# Patient Record
Sex: Male | Born: 1970 | Race: White | Hispanic: No | Marital: Married | Smoking: Former smoker
Health system: Southern US, Community
[De-identification: ages and names within clinical notes are randomized; demographics above are authoritative.]

## PROBLEM LIST (undated history)

## (undated) DIAGNOSIS — F1010 Alcohol abuse, uncomplicated: Secondary | ICD-10-CM

## (undated) DIAGNOSIS — J45909 Unspecified asthma, uncomplicated: Secondary | ICD-10-CM

## (undated) DIAGNOSIS — R600 Localized edema: Secondary | ICD-10-CM

## (undated) DIAGNOSIS — F1990 Other psychoactive substance use, unspecified, uncomplicated: Secondary | ICD-10-CM

## (undated) DIAGNOSIS — F988 Other specified behavioral and emotional disorders with onset usually occurring in childhood and adolescence: Secondary | ICD-10-CM

## (undated) DIAGNOSIS — F419 Anxiety disorder, unspecified: Secondary | ICD-10-CM

## (undated) DIAGNOSIS — R0602 Shortness of breath: Secondary | ICD-10-CM

## (undated) DIAGNOSIS — K219 Gastro-esophageal reflux disease without esophagitis: Secondary | ICD-10-CM

## (undated) DIAGNOSIS — F32A Depression, unspecified: Secondary | ICD-10-CM

## (undated) DIAGNOSIS — T798XXA Other early complications of trauma, initial encounter: Secondary | ICD-10-CM

## (undated) DIAGNOSIS — K760 Fatty (change of) liver, not elsewhere classified: Secondary | ICD-10-CM

## (undated) DIAGNOSIS — C801 Malignant (primary) neoplasm, unspecified: Secondary | ICD-10-CM

## (undated) DIAGNOSIS — M549 Dorsalgia, unspecified: Secondary | ICD-10-CM

## (undated) DIAGNOSIS — J439 Emphysema, unspecified: Secondary | ICD-10-CM

## (undated) DIAGNOSIS — M2550 Pain in unspecified joint: Secondary | ICD-10-CM

## (undated) HISTORY — DX: Pain in unspecified joint: M25.50

## (undated) HISTORY — DX: Localized edema: R60.0

## (undated) HISTORY — DX: Alcohol abuse, uncomplicated: F10.10

## (undated) HISTORY — DX: Emphysema, unspecified: J43.9

## (undated) HISTORY — DX: Anxiety disorder, unspecified: F41.9

## (undated) HISTORY — DX: Other psychoactive substance use, unspecified, uncomplicated: F19.90

## (undated) HISTORY — DX: Other specified behavioral and emotional disorders with onset usually occurring in childhood and adolescence: F98.8

## (undated) HISTORY — DX: Other early complications of trauma, initial encounter: T79.8XXA

## (undated) HISTORY — DX: Shortness of breath: R06.02

## (undated) HISTORY — PX: KNEE ARTHROSCOPY W/ ACL RECONSTRUCTION: SHX1858

## (undated) HISTORY — DX: Fatty (change of) liver, not elsewhere classified: K76.0

## (undated) HISTORY — DX: Gastro-esophageal reflux disease without esophagitis: K21.9

## (undated) HISTORY — DX: Malignant (primary) neoplasm, unspecified: C80.1

## (undated) HISTORY — DX: Depression, unspecified: F32.A

## (undated) HISTORY — DX: Dorsalgia, unspecified: M54.9

---

## 2005-09-11 ENCOUNTER — Emergency Department: Payer: Self-pay | Admitting: Emergency Medicine

## 2009-07-16 ENCOUNTER — Encounter: Payer: Self-pay | Admitting: Family Medicine

## 2009-08-08 ENCOUNTER — Encounter: Payer: Self-pay | Admitting: Family Medicine

## 2009-09-08 ENCOUNTER — Encounter: Payer: Self-pay | Admitting: Family Medicine

## 2009-10-08 ENCOUNTER — Encounter: Payer: Self-pay | Admitting: Family Medicine

## 2010-08-23 ENCOUNTER — Ambulatory Visit: Payer: Self-pay | Admitting: Neurosurgery

## 2011-08-12 ENCOUNTER — Ambulatory Visit: Payer: Self-pay | Admitting: Sports Medicine

## 2011-08-25 ENCOUNTER — Ambulatory Visit: Payer: Self-pay | Admitting: General Practice

## 2014-08-02 NOTE — Op Note (Signed)
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   POSTOPERATIVE DIAGNOSES:  1. 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  2. Medial meniscus tear.   PROCEDURES PERFORMED: Left knee arthroscopy, removal of osteochondral loose bodies, and partial medial meniscectomy.   SURGEON: Laurice Record. Hooten, MD  ANESTHESIA: General.   ESTIMATED BLOOD LOSS: Minimal.   TOURNIQUET TIME: Not used.   DRAINS: None.   INDICATIONS FOR SURGERY: The patient is a 44 year old male who has been seen for some left knee pain and palpable masses along the medial aspect of the knee. MRI demonstrated findings consistent with osteochondral loose bodies. After discussion of the risks and benefits of surgical intervention, the patient expressed his understanding of the risks and benefits and agreed with plans for surgical intervention. Of note, the patient does have a previous history of anterior cruciate ligament reconstruction.   PROCEDURE IN DETAIL: The patient was brought into the operating room and, after adequate general anesthesia was achieved, a tourniquet was placed on the patient's left thigh and the leg was placed in a leg holder. All bony prominences were well padded. The patient's left knee and leg were cleaned and prepped with alcohol and DuraPrep and draped in the usual sterile fashion. The anticipated portal sites were injected with 0.25% Marcaine with epinephrine. An anterolateral portal was created and cannula was inserted. The scope was inserted and the knee was distended with fluid using the DePuy Mitek pump. The scope was advanced down the medial gutter. There were three osteochondral loose bodies with some synovial attachment along the medial gutter. These corresponded with the palpable masses. The scope was continued into the medial compartment. Under visualization with  the scope, an anteromedial portal was created and hook probe was inserted. Inspection of the medial compartment showed the articular surface to be in reasonably good condition. There was a small tear of the posterior horn of the medial meniscus which was debrided using meniscal punch and the 50 degree ArthroCare wand. The remaining rim of meniscus was visualized and probed and felt to be stable. The scope was then repositioned so as to visualize the osteochondral loose bodies. A pituitary rongeur was then used to remove the three osteochondral loose bodies. The site was then further debrided using a 4.5 mm shaver and hemostasis achieved using the ArthroCare wand. The scope was then advanced into the intercondylar region. The anterior cruciate ligament graft was visualized and probed and felt to be stable. Graft was functioning well with Lachman's test performed under visualization with the scope. The scope was removed from the anterolateral portal and reinserted via the anteromedial portal so as to better visualize the lateral compartment. The articular surface was in reasonably good condition. The lateral meniscus was visualized and probed and felt to stable. Finally, the scope was advanced so as to visualize the patellofemoral articulation. Good patellar tracking was noted. The articular surface was in good condition.   The knee was irrigated with copious amounts of fluid and then suctioned dry. The anterolateral portal was reapproximated using 3-0 nylon. A combination of 0.25% Marcaine with epinephrine and 4 mg of morphine was injected via the scope. The scope was removed and the anteromedial portal was reapproximated using 3-0 nylon. Sterile dressing was applied followed by application of an ice wrap.   The patient tolerated the procedure well. He was transported to the recovery room  in stable condition.  ____________________________ Laurice Record. Holley Bouche., MD jph:slb D: 08/26/2011 10:25:51  ET T: 08/26/2011 11:58:32 ET JOB#: [id number]  cc: Jeneen Rinks P. Holley Bouche., MD, <Dictator> Laurice Record Holley Bouche MD ELECTRONICALLY SIGNED 08/26/2011 21:33

## 2020-07-12 ENCOUNTER — Telehealth: Payer: Self-pay | Admitting: *Deleted

## 2020-07-12 NOTE — Telephone Encounter (Signed)
Attempted to contact to schedule lung screening scan. However there is no valid phone number in patient's chart.   dhs

## 2021-02-20 ENCOUNTER — Other Ambulatory Visit: Payer: Self-pay

## 2021-02-20 ENCOUNTER — Emergency Department: Payer: BC Managed Care – PPO

## 2021-02-20 ENCOUNTER — Emergency Department
Admission: EM | Admit: 2021-02-20 | Discharge: 2021-02-20 | Disposition: A | Discharge status: Home / Self Care | Payer: BC Managed Care – PPO | Attending: Emergency Medicine | Admitting: Emergency Medicine

## 2021-02-20 DIAGNOSIS — R0789 Other chest pain: Secondary | ICD-10-CM | POA: Diagnosis not present

## 2021-02-20 DIAGNOSIS — R059 Cough, unspecified: Secondary | ICD-10-CM | POA: Insufficient documentation

## 2021-02-20 DIAGNOSIS — J45909 Unspecified asthma, uncomplicated: Secondary | ICD-10-CM | POA: Insufficient documentation

## 2021-02-20 DIAGNOSIS — R0602 Shortness of breath: Secondary | ICD-10-CM | POA: Insufficient documentation

## 2021-02-20 DIAGNOSIS — J029 Acute pharyngitis, unspecified: Secondary | ICD-10-CM | POA: Diagnosis not present

## 2021-02-20 DIAGNOSIS — Z20822 Contact with and (suspected) exposure to covid-19: Secondary | ICD-10-CM | POA: Insufficient documentation

## 2021-02-20 HISTORY — DX: Unspecified asthma, uncomplicated: J45.909

## 2021-02-20 LAB — BASIC METABOLIC PANEL
Anion gap: 9 (ref 5–15)
BUN: 13 mg/dL (ref 6–20)
CO2: 22 mmol/L (ref 22–32)
Calcium: 9 mg/dL (ref 8.9–10.3)
Chloride: 105 mmol/L (ref 98–111)
Creatinine, Ser: 1.26 mg/dL — ABNORMAL HIGH (ref 0.61–1.24)
GFR, Estimated: >60 mL/min (ref >60)
Glucose, Bld: 90 mg/dL (ref 70–99)
Potassium: 3.9 mmol/L (ref 3.5–5.1)
Sodium: 136 mmol/L (ref 135–145)

## 2021-02-20 LAB — CBC
HCT: 46.1 % (ref 39.0–52.0)
Hemoglobin: 15.7 g/dL (ref 13.0–17.0)
MCH: 30.7 pg (ref 26.0–34.0)
MCHC: 34.1 g/dL (ref 30.0–36.0)
MCV: 90 fL (ref 80.0–100.0)
Platelets: 206 10*3/uL (ref 150–400)
RBC: 5.12 MIL/uL (ref 4.22–5.81)
RDW: 11.6 % (ref 11.5–15.5)
WBC: 6.1 10*3/uL (ref 4.0–10.5)
nRBC: 0 % (ref 0.0–0.2)

## 2021-02-20 LAB — RESP PANEL BY RT-PCR (FLU A&B, COVID) ARPGX2
Influenza A by PCR: NEGATIVE
Influenza B by PCR: NEGATIVE
SARS Coronavirus 2 by RT PCR: NEGATIVE

## 2021-02-20 LAB — TROPONIN I (HIGH SENSITIVITY): Troponin I (High Sensitivity): 3 ng/L (ref <18)

## 2021-02-20 NOTE — ED Provider Notes (Signed)
[facility] Emergency Department Provider Note    ____________________________________________   Event Date/Time   First MD Initiated Contact with Patient 02/20/21 1455     (approximate)  I have reviewed the triage vital signs and the nursing notes.   HISTORY  Chief Complaint Shortness of Breath and Chest Pain   HPI [name] [name] is a 50 y.o. male, history of asthma, presents to the emergency department for evaluation of chest pain and shortness of breath x3 days.  Patient states that he initially experienced sore throat, sinus congestion, and chest discomfort approximately 1 month prior.  After a few weeks his symptoms resolved.  However patient states that the past 3 to 4 days, he has been experiencing a recurrence of the same symptoms, plus notable wheezing when walking for prolonged periods.  He he endorses chest tightness and soreness following coughing episodes.  Denies fevers/chills, back pain, abdominal pain, numbness or tingliness in his extremities, urinary symptoms, or nausea/vomiting/diarrhea.  History limited by: None  Past Medical History:  Diagnosis Date   Asthma     There are no problems to display for this patient.    Prior to Admission medications   Not on File    Allergies Patient has no known allergies.  No family history on file.  Social History    Review of Systems  Constitutional: Negative for fever/chills, weight loss, or fatigue.  Eyes: Negative for visual changes or discharge.  ENT: Positive for sinus congestion. negative for hearing changes, or sore throat.  Respiratory: Positive for chest discomfort and shortness of breath while walking.  Gastrointestinal: Negative for abdominal pain, nausea/vomiting, or diarrhea.  Genitourinary: Negative for dysuria or hematuria.  Musculoskeletal: Negative for back pain or joint pain.  Skin: Negative for rashes or lesions.  Neurological: Negative for headache, syncope,  dizziness, tremors, or numbness/tingling.   10-point ROS otherwise negative. ____________________________________________   PHYSICAL EXAM:  VITAL SIGNS: ED Triage Vitals  Enc Vitals Group     BP 02/20/21 1331 122/80     Pulse Rate 02/20/21 1331 89     Resp 02/20/21 1331 20     Temp 02/20/21 1331 98.5 F (36.9 C)     Temp Source 02/20/21 1331 Oral     SpO2 02/20/21 1331 95 %     Weight 02/20/21 1331 275 lb (124.7 kg)     Height 02/20/21 1331 5\' 11"  (1.803 m)     Head Circumference --      Peak Flow --      Pain Score 02/20/21 1334 0     Pain Loc --      Pain Edu? --      Excl. in [place]? --     Physical Exam Constitutional:      Appearance: Normal appearance.  HENT:     Head: Normocephalic.     Nose: Nose normal.     Mouth/Throat:     Mouth: Mucous membranes are dry.     Pharynx: No oropharyngeal exudate or posterior oropharyngeal erythema.  Eyes:     Extraocular Movements: Extraocular movements intact.     Conjunctiva/sclera: Conjunctivae normal.     Pupils: Pupils are equal, round, and reactive to light.  Cardiovascular:     Rate and Rhythm: Normal rate.     Heart sounds: Normal heart sounds.  Pulmonary:     Effort: Pulmonary effort is normal.     Breath sounds: Normal breath sounds.     Comments: Mild rhonchi noted in the  left basilar region. Abdominal:     General: Abdomen is flat.     Palpations: Abdomen is soft.  Musculoskeletal:        General: Normal range of motion.     Cervical back: Normal range of motion and neck supple. No rigidity.     Right lower leg: No edema.     Left lower leg: No edema.  Skin:    General: Skin is warm and dry.  Neurological:     Mental Status: He is alert and oriented to person, place, and time.     Cranial Nerves: No cranial nerve deficit.     Sensory: No sensory deficit.     Motor: No weakness.     Coordination: Coordination normal.  Psychiatric:        Mood and Affect: Mood normal.        Behavior: Behavior normal.         Thought Content: Thought content normal.        Judgment: Judgment normal.     ____________________________________________    LABS  (all labs ordered are listed, but only abnormal results are displayed)  Labs Reviewed  BASIC METABOLIC PANEL - Abnormal; Notable for the following components:      Result Value   Creatinine, Ser 1.26 (*)    All other components within normal limits  RESP PANEL BY RT-PCR (FLU A&B, COVID) [id number]  CBC  TROPONIN I (HIGH SENSITIVITY)     ____________________________________________   EKG ED ECG REPORT I personally viewed and interpreted this ECG.   Date: 02/20/2021  EKG Time: 1326  Rate: 86  Rhythm: normal EKG, normal sinus rhythm, unchanged from previous tracings  Axis: No deviation  Intervals:none  ST&T Change: No changes    ____________________________________________    RADIOLOGY I personally viewed and evaluated these images as part of my medical decision making, as well as reviewing the written report by the radiologist.  ED Provider Interpretation: I agree with the radiologist interpretation.  Left basilar opacity noted.   DG Chest 2 View  Result Date: 02/20/2021 CLINICAL DATA:  Chest pain EXAM: CHEST - 2 VIEW COMPARISON:  None. FINDINGS: The heart size and mediastinal contours are within normal limits. Mild left basilar opacity, likely due to atelectasis. Lungs otherwise clear. The visualized skeletal structures are unremarkable. IMPRESSION: Mild left basilar opacity, likely due to atelectasis. Lungs otherwise clear. Electronically Signed   By: Yetta Glassman M.D.   On: 02/20/2021 14:09    ____________________________________________   PROCEDURES  Procedures   Medications - No data to display  Critical Care performed: No  ____________________________________________   INITIAL IMPRESSION / ASSESSMENT AND PLAN / ED COURSE  Pertinent labs & imaging results that were available during my care of the patient were  reviewed by me and considered in my medical decision making (see chart for details).     [name] [name] is a 50 y.o. male, history of asthma, presents to the emergency department for evaluation of chest discomfort and shortness of breath.  Patient states that symptoms initially started a month prior, resolved after a few weeks, and have now recently recurred over the past 3 to 4 days with subjective wheezing while walking.  Differentials include, but not limited to: Serious: pulmonary embolism, pneumothorax, ACS, CHF exacerbation, thoracic trauma, anaphylaxis Common:viral syndrome, bronchitis, COPD exacerbation, pneumonia, asthma  On exam, patient appears well.  Vital signs are within normal limits.  Mild rhonchi noted in the left basilar lung field, but otherwise unimpressive.  No concerning murmurs noted.  Initial EKG shows sinus rhythm with no ST segment changes.  Initial troponin unremarkable.  Patient's history is not concerning for cardiac pathology. The second troponin was discontinued.   Chest x-ray shows opacity in the left basilar region, atelectasis.  I suspect that this is likely due to a viral syndrome, exacerbated by history of asthma.  History and physical exam not concerning for pneumonia.   Negative for influenza or COVID.  I suspect that the patient is experiencing residual symptoms from prior respiratory viral versus progression of chronic asthma.  We will plan to discharge patient with primary care follow-up.  He is currently scheduled for a physical this Friday.        Clinical Course as of 02/20/21 1706  Sun Feb 20, 2021  1701 Resp Panel by RT-PCR (Flu A&B, Covid) Nasopharyngeal Swab Respiratory panel negative for influenza or COVID.  [BS]    Clinical Course User Index [BS] Teodoro Spray, PA    ____________________________________________   FINAL CLINICAL IMPRESSION(S) / ED DIAGNOSES  Final diagnoses:  Shortness of breath     NEW  MEDICATIONS STARTED DURING THIS VISIT:  ED Discharge Orders     None        Note:  This document was prepared using Dragon voice recognition software and may include unintentional dictation errors.    Teodoro Spray, [place] 02/20/21 1706    Vladimir Crofts, MD 02/20/21 1728

## 2021-02-20 NOTE — ED Triage Notes (Signed)
Pt states he has had chest tightness and SHOB x3 days- pt states he feels like he has got a cold with congestion and cough- pt states he thought he was going to be okay until he tried to go to work this AM and by the time he walked across the parking lot he was winded

## 2021-02-20 NOTE — Discharge Instructions (Addendum)
Follow up with your PCP at your next physical this Friday.

## 2021-02-20 NOTE — ED Notes (Signed)
Dc ppw provided. Followup care reviewed. Pt denies questions at this time. Pt verbal consent for dc given. Pt assisted off unit on foot. Vs refused at dc

## 2021-03-02 ENCOUNTER — Other Ambulatory Visit: Payer: Self-pay | Admitting: Family Medicine

## 2021-03-02 DIAGNOSIS — R9389 Abnormal findings on diagnostic imaging of other specified body structures: Secondary | ICD-10-CM

## 2021-03-21 ENCOUNTER — Ambulatory Visit
Admission: RE | Admit: 2021-03-21 | Discharge: 2021-03-21 | Disposition: A | Discharge status: Home / Self Care | Payer: BC Managed Care – PPO | Attending: Family Medicine | Admitting: Family Medicine

## 2021-03-21 ENCOUNTER — Ambulatory Visit
Admission: RE | Admit: 2021-03-21 | Discharge: 2021-03-21 | Disposition: A | Discharge status: Home / Self Care | Payer: BC Managed Care – PPO | Source: Ambulatory Visit | Attending: Family Medicine | Admitting: Family Medicine

## 2021-03-21 DIAGNOSIS — R9389 Abnormal findings on diagnostic imaging of other specified body structures: Secondary | ICD-10-CM | POA: Diagnosis not present

## 2021-05-02 ENCOUNTER — Other Ambulatory Visit: Payer: Self-pay | Admitting: *Deleted

## 2021-05-02 DIAGNOSIS — Z87891 Personal history of nicotine dependence: Secondary | ICD-10-CM

## 2021-05-18 ENCOUNTER — Telehealth (INDEPENDENT_AMBULATORY_CARE_PROVIDER_SITE_OTHER): Payer: BC Managed Care – PPO | Admitting: Acute Care

## 2021-05-18 ENCOUNTER — Encounter: Payer: Self-pay | Admitting: Acute Care

## 2021-05-18 ENCOUNTER — Other Ambulatory Visit: Payer: Self-pay

## 2021-05-18 ENCOUNTER — Ambulatory Visit
Admission: RE | Admit: 2021-05-18 | Discharge: 2021-05-18 | Disposition: A | Discharge status: Home / Self Care | Payer: BC Managed Care – PPO | Source: Ambulatory Visit | Attending: Acute Care | Admitting: Acute Care

## 2021-05-18 DIAGNOSIS — Z87891 Personal history of nicotine dependence: Secondary | ICD-10-CM

## 2021-05-18 NOTE — Patient Instructions (Signed)
Thank you for participating in the [place] Lung Cancer Screening Program. °It was our pleasure to meet you today. °We will call you with the results of your scan within the next few days. °Your scan will be assigned a Lung RADS category score by the physicians reading the scans.  °This Lung RADS score determines follow up scanning.  °See below for description of categories, and follow up screening recommendations. °We will be in touch to schedule your follow up screening annually or based on recommendations of our providers. °We will fax a copy of your scan results to your Primary Care Physician, or the physician who referred you to the program, to ensure they have the results. °Please call the office if you have any questions or concerns regarding your scanning experience or results.  °Our office number is [phone]. °Please speak with Denise Phelps, RN. She is our Lung Cancer Screening RN. °If she is unavailable when you call, please have the office staff send her a message. She will return your call at her earliest convenience. °Remember, if your scan is normal, we will scan you annually as long as you continue to meet the criteria for the program. (Age 55-77, Current smoker or smoker who has quit within the last 15 years). °If you are a smoker, remember, quitting is the single most powerful action that you can take to decrease your risk of lung cancer and other pulmonary, breathing related problems. °We know quitting is hard, and we are here to help.  °Please let us know if there is anything we can do to help you meet your goal of quitting. °If you are a former smoker, congratulations. We are proud of you! Remain smoke free! °Remember you can refer friends or family members through the number above.  °We will screen them to make sure they meet criteria for the program. °Thank you for helping us take better care of you by participating in Lung Screening. ° °You can receive free nicotine replacement therapy  ( patches, gum or mints) by calling 1-800-QUIT NOW. Please call so we can get you on the path to becoming  a non-smoker. I know it is hard, but you can do this! ° °Lung RADS Categories: ° °Lung RADS 1: no nodules or definitely non-concerning nodules.  °Recommendation is for a repeat annual scan in 12 months. ° °Lung RADS 2:  nodules that are non-concerning in appearance and behavior with a very low likelihood of becoming an active cancer. °Recommendation is for a repeat annual scan in 12 months. ° °Lung RADS 3: nodules that are probably non-concerning , includes nodules with a low likelihood of becoming an active cancer.  Recommendation is for a 6-month repeat screening scan. Often noted after an upper respiratory illness. We will be in touch to make sure you have no questions, and to schedule your 6-month scan. ° °Lung RADS 4 A: nodules with concerning findings, recommendation is most often for a follow up scan in 3 months or additional testing based on our provider's assessment of the scan. We will be in touch to make sure you have no questions and to schedule the recommended 3 month follow up scan. ° °Lung RADS 4 B:  indicates findings that are concerning. We will be in touch with you to schedule additional diagnostic testing based on our provider's  assessment of the scan. ° °Hypnosis for smoking cessation  °[employer] °[phone] ° °Acupuncture for smoking cessation  °[facility] °[phone]  °

## 2021-05-18 NOTE — Progress Notes (Signed)
Virtual Visit via Video Note  I connected with [name] [name] on 02/22/21 at  2:00 PM EST by telephone and verified that I am speaking with the correct person using two identifiers.  Location: Patient: Home Provider: Working from home   I discussed the limitations, risks, security and privacy concerns of performing an evaluation and management service by telephone and the availability of in person appointments. I also discussed with the patient that there may be a patient responsible charge related to this service. The patient expressed understanding and agreed to proceed.  Shared Decision Making Visit Lung Cancer Screening Program [phone])   Eligibility: Age 51 y.o. Pack Years Smoking History Calculation 29 (# packs/per year x # years smoked) Recent History of coughing up blood  no Unexplained weight loss? no ( >Than 15 pounds within the last 6 months ) Prior History Lung / other cancer no (Diagnosis within the last 5 years already requiring surveillance chest CT Scans). Smoking Status Former Smoker Former Smokers: Years since quit: 8 years  Quit Date: 2015  Visit Components: Discussion included one or more decision making aids. yes Discussion included risk/benefits of screening. yes Discussion included potential follow up diagnostic testing for abnormal scans. yes Discussion included meaning and risk of over diagnosis. yes Discussion included meaning and risk of False Positives. yes Discussion included meaning of total radiation exposure. yes  Counseling Included: Importance of adherence to annual lung cancer LDCT screening. yes Impact of comorbidities on ability to participate in the program. yes Ability and willingness to under diagnostic treatment. yes  Smoking Cessation Counseling: Current Smokers:  Discussed importance of smoking cessation. yes Information about tobacco cessation classes and interventions provided to patient. yes Patient provided with  "ticket" for LDCT Scan. yes Symptomatic Patient. no  Counseling NA Diagnosis Code: Tobacco Use Z72.0 Asymptomatic Patient yes  Counseling NA Former Smokers:  Discussed the importance of maintaining cigarette abstinence. yes Diagnosis Code: Personal History of Nicotine Dependence. J24.268 Information about tobacco cessation classes and interventions provided to patient. Yes Patient provided with "ticket" for LDCT Scan. yes Written Order for Lung Cancer Screening with LDCT placed in Epic. Yes (CT Chest Lung Cancer Screening Low Dose W/O CM) [id number] Z12.2-Screening of respiratory organs Z87.891-Personal history of nicotine dependence   I spent 25 minutes of face to face time with him discussing the risks and benefits of lung cancer screening. We viewed a power point together that explained in detail the above noted topics. We took the time to pause the power point at intervals to allow for questions to be asked and answered to ensure understanding. We discussed that he had taken the single most powerful action possible to decrease his risk of developing lung cancer when he quit smoking. I counseled him to remain smoke free, and to contact me if he ever had the desire to smoke again so that I can provide resources and tools to help support the effort to remain smoke free. We discussed the time and location of the scan, and that either  [name] or I will call with the results within  24-48 hours of receiving them. He has my card and contact information in the event he needs to speak with me, in addition to a copy of the power point we reviewed as a resource. He verbalized understanding of all of the above and had no further questions upon leaving the office.     I explained to the patient that there has been a high incidence of  coronary artery disease noted on these exams. I explained that this is a non-gated exam therefore degree or severity cannot be determined. This patient is not on  statin therapy. I have asked the patient to follow-up with their PCP regarding any incidental finding of coronary artery disease and management with diet or medication as they feel is clinically indicated. The patient verbalized understanding of the above and had no further questions.   [name] [name] D. [name], NP-C [facility] Personal contact information can be found on Amion  05/18/2021, 8:13 AM

## 2021-05-19 ENCOUNTER — Telehealth: Payer: Self-pay | Admitting: Acute Care

## 2021-05-19 NOTE — Telephone Encounter (Signed)
Spoke with rad tech and was advised of call report for pts LDCT from 05/19/21. Impression is below. Will forward to Eric Form, NP, as Juluis Rainier as these results are called through the lung cancer screening program.    IMPRESSION: 1. Lung-RADS 3, probably benign findings. Short-term follow-up in 6 months is recommended with repeat low-dose chest CT without contrast (please use the following order, "CT CHEST LCS NODULE FOLLOW-UP W/O CM").   6.9 mm subpleural posteromedial right lower lobe nodule.   These results will be called to the ordering clinician or representative by the Radiologist Assistant, and communication documented in the PACS or [employer]. 2. Coronary artery calcification. 3.  Emphysema (ICD10-J43.9).     Electronically Signed   By: Lorin Picket M.D.   On: 05/19/2021 11:55

## 2021-05-26 ENCOUNTER — Telehealth: Payer: Self-pay | Admitting: Acute Care

## 2021-05-26 NOTE — Telephone Encounter (Signed)
I  have attempted to call the patient with the results of his low-dose CT.  There was no answer.  I have left a HIPAA compliant message on the patient's answering machine requesting that he call the office for the results of his scan.  I left the office contact information on the voicemail. Denise patient will need a 61-month follow-up low-dose CT, and once we have talked to him we will need to fax the results to his PCP. Thank so much

## 2021-05-27 ENCOUNTER — Telehealth: Payer: Self-pay | Admitting: Acute Care

## 2021-05-27 ENCOUNTER — Other Ambulatory Visit: Payer: Self-pay

## 2021-05-27 DIAGNOSIS — Z87891 Personal history of nicotine dependence: Secondary | ICD-10-CM

## 2021-05-27 DIAGNOSIS — R911 Solitary pulmonary nodule: Secondary | ICD-10-CM

## 2021-05-27 NOTE — Telephone Encounter (Signed)
I have attempted to call th patient with the results of his scan as he called and left a message after missing my call yesterday. There was no answer. We will call again this afternoon.

## 2021-05-27 NOTE — Telephone Encounter (Signed)
Contacted patient to review LDCT results. Lung nodule noted with area of mucoid impaction.  Patient states he has had increased chest congestion since November 2022 which has not improved.  He has been given Symbicort recently but states nothing has really helped the congestion.  Patient informed recommendation is to have follow up CT scan in 6 months to monitor the area of concern.  Patient agrees with plan.  Also informed of calcification in coronary arteries and emphysema.  Patient is not on a statin medication. Will route results and plan to PCP for review.  Pulmonary consult offered for patient's complaint of chest congestion and some recent fatigue.  Consult accepted and scheduled.  Patient acknowledged understanding and had no further questions.

## 2021-05-30 NOTE — Telephone Encounter (Signed)
See 05/27/21 phone note for called results

## 2021-06-08 ENCOUNTER — Other Ambulatory Visit: Payer: Self-pay

## 2021-06-08 ENCOUNTER — Encounter: Payer: BC Managed Care – PPO | Admitting: *Deleted

## 2021-06-08 ENCOUNTER — Encounter: Payer: Self-pay | Admitting: Pulmonary Disease

## 2021-06-08 ENCOUNTER — Ambulatory Visit: Payer: BC Managed Care – PPO | Admitting: Pulmonary Disease

## 2021-06-08 VITALS — BP 126/82 | HR 93 | Ht 71.0 in | Wt 272.0 lb

## 2021-06-08 DIAGNOSIS — R911 Solitary pulmonary nodule: Secondary | ICD-10-CM

## 2021-06-08 DIAGNOSIS — J4520 Mild intermittent asthma, uncomplicated: Secondary | ICD-10-CM

## 2021-06-08 DIAGNOSIS — J432 Centrilobular emphysema: Secondary | ICD-10-CM | POA: Diagnosis not present

## 2021-06-08 DIAGNOSIS — Z87891 Personal history of nicotine dependence: Secondary | ICD-10-CM | POA: Diagnosis not present

## 2021-06-08 DIAGNOSIS — Z006 Encounter for examination for normal comparison and control in clinical research program: Secondary | ICD-10-CM

## 2021-06-08 NOTE — Research (Signed)
Title: [place]: CliNIcal Utility of ManaGement of Patients witH CT and LDCT Identified Pulmonary Nodules UsinG the Percepta NasAL Swab ClassifiEr -- with Familiarization ?  ?Protocol #: [id number] Sponsor: [employer] ?  ?Protocol Revision 1 dated 01Sep2022 and confirmed current on today's visit, IRB approved Revision 1 on 29Dec2022. ?  ?Objectives:  ?Primary: To evaluate if use of the Percepta Nasal Swab test in the diagnostic work up ?of newly identified pulmonary nodules reduces the number of invasive procedures in the ?group classified as low-risk by the test and that are benign as compared to a control ?group managed without a Percepta Nasal Swab test result. ?                  A newly identified nodule is defined as any nodule first identified on imaging ?                  <90 days prior to nasal sample collection that hasn?t undergone a diagnostic ?                   procedure for the management of their index nodule prior to enrollment. ?                  CT imaging includes conventional CT, LDCT, HRCT ?                  Benign diagnosis is defined as a specific diagnosis of a benign condition, ?                   radiographic resolution or stability at ? 24 months, or no cytological, ?                   radiological, or pathological evidence of cancer. ?                  Procedures will be categorized as either invasive or non-invasive in the Data ?                   Management Plan (DMP). ?Secondary: To evaluate if use of the Percepta Nasal Swab test in the diagnostic work ?up of newly identified pulmonary nodules increases the proportion of subjects classified ?as high-risk by the test and have primary lung cancer that go directly to appropriate ?therapy as compared to a control group managed without a Percepta Nasal Swab test ?result. ?                   Proportion of subjects that go directly to appropriate therapy is defined as those ?                    subjects that undergo surgery, ablative  or other appropriate therapy as the next ?                    step after the Percepta Nasal Swab test result without intervening non-surgical ?                    procedures ?                            a. Non-surgical procedures include diagnostic PET, but not PET for ?                                  staging purposes. ?                            b. Appropriate therapies will be defined in the CRF. ?                   A newly identified nodule is defined as any nodule first identified on imaging ?                   <90 days prior to nasal sample collection. ?                   Lung cancer diagnosis is defined as established by cytology or pathology, or in ?                    circumstances where a presumptive diagnosis of cancer led to definitive ?                    ablative or other appropriate therapy without pathology. ?Key Inclusion Criteria: ? Inclusion Criteria: ? Able to tolerate nasal epithelial specimen collection ? Signed written Informed Consent obtained ? Subject clinical history available for review by sponsor and regulatory agencies ? New nodule first identified on imaging < 90 days prior to nasal sample collection ?(index nodule) ? CT report available for index nodule ? 19 - 1 years of age ? Current or former smoker (>100 cigarettes in a lifetime) ? Pulmonary nodule ?30 mm detected by CT ? ?Key Exclusion Criteria: ?Exclusion Criteria ? Subject has undergone a diagnostic procedure for the management of their ?index nodule after the index CT and prior to enrollment ? Active cancer (other than non-melanoma skin cancer) ? Prior primary lung cancer (prior non-lung cancer acceptable) ? Prior participation in this study (i.e., subjects may not be enrolled more than ?once) ? Current active treatment with an investigational device or drug (patients in trial ?follow up period are okay if intervention phase is complete) ? Patient enrolled or planned to be enrolled in another clinical trial that may ?influence  management of the patient?s nodule ? Concurrent or planned use of tools or tests for assigning lung nodule risk of ?malignancy (e.g., genomic or proteomic blood tests) other than clinically ?validated risk calculators ? ?Clinical Research Coordinator / Research RN note : This visit is for enrollment / baseline Subject [id number] with DOB: [id number] on 01Mar2023 for the above protocol is an Enrollment Visit and is for purpose of research.  ?  ?Subject expressed interest and consent in continuing as a study subject. Subject confirmed contact information (e.g. address, telephone, email). Subject thanked for participation in research and contribution to science.   ?  ?During this visit on 01MAR2023  , the subject reviewed and signed the consent form, provided demographics, and had a nasal swab collected per the above referenced protocol. Please refer to the subject's paper source binder for further details.  ? ?The PI and Sub-I met/discussed the subject prior to consenting patient.  The sub-Investigator  Eric Form, NP  was present for the consenting process.  ?     ?  ?Signed by ?Jaye Beagle RN, CRN II ? ?

## 2021-06-08 NOTE — Patient Instructions (Addendum)
Continue symbicort inhaler 2 puffs twice daily ?- rinse mouth out after each use ? ?Use flutter valve twice daily to help clear chest congestion ?- we will order you a device from a medical equipment company ? ?We can consider nebulizer treatments in the future or increasing your symbicort dosing. ? ?Follow up in 3 months with pulmonary function tests ? ?

## 2021-06-08 NOTE — Progress Notes (Signed)
? ?Synopsis: Referred in March 2023 for chest congestion ? ?Subjective:  ? ?PATIENT ID: [name] [name] GENDER: male DOB: 03-16-1971, MRN: [id number] ? ?HPI ? ?Chief Complaint  ?Patient presents with  ? Consult  ?  Self referral for chronic cough since Nov 2022, productive cough with white phlegm. Increased chest tightness, wheezing and SOB.   ? ?[name] is a 51 year old male, former smoker who is referred to pulmonary clinic for chest congestion. ? ?He has been seen by our lung cancer screening team previously and is being follow for pulmonary nodules. The CT Chest scan also showed emphysema and areas of mucoid impaction.  ? ?Patient reports ongoing chest congestion since last fall after experiencing upper respiratory viral infection.  He has intermittent shortness of breath, cough and wheezing.  When he is active he is able to have productive cough which helps him feel better after the mucus is cleared.  He tried Mucinex previously without much relief.  He reports sitting in a reclined position can make his symptoms worse.  He is sleeping well with no nighttime awakenings due to cough.  He started Symbicort 80-4.5 mcg 1 puff twice daily with some improvement in his symptoms. ? ?He is a former smoker and quit in 2015.  He has a 30-pack-year smoking history and is currently undergoing lung cancer screening.  He works as a Pharmacist, community at [employer].  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  He does not report any known asbestos exposure.  He also previously worked as a Audiological scientist. ? ?Past Medical History:  ?Diagnosis Date  ? Asthma   ?  ? ?No family history on file.  ? ?Social History  ? ?Socioeconomic History  ? Marital status: Married  ?  Spouse name: Not on file  ? Number of children: Not on file  ? Years of education: Not on file  ? Highest education level: Not on file  ?Occupational History  ? Not on file  ?Tobacco Use   ? Smoking status: Former  ?  Packs/day: 1.00  ?  Years: 29.00  ?  Pack years: 29.00  ?  Types: Cigarettes  ?  Quit date: 2015  ?  Years since quitting: 8.1  ? Smokeless tobacco: Not on file  ?Substance and Sexual Activity  ? Alcohol use: Not on file  ? Drug use: Not on file  ? Sexual activity: Not on file  ?Other Topics Concern  ? Not on file  ?Social History Narrative  ? Not on file  ? ?Social Determinants of Health  ? ?Financial Resource Strain: Not on file  ?Food Insecurity: Not on file  ?Transportation Needs: Not on file  ?Physical Activity: Not on file  ?Stress: Not on file  ?Social Connections: Not on file  ?Intimate Partner Violence: Not on file  ?  ? ?No Known Allergies  ? ?Outpatient Medications Prior to Visit  ?Medication Sig Dispense Refill  ? budesonide-formoterol (SYMBICORT) 80-4.5 MCG/ACT inhaler Inhale 1 puff into the lungs 2 (two) times daily.    ? clobetasol ointment (TEMOVATE) 0.05 % Apply topically 2 (two) times daily.    ? COMBIVENT RESPIMAT 20-100 MCG/ACT AERS respimat Inhale 1 puff into the lungs 4 (four) times daily.    ? LORazepam (ATIVAN) 0.5 MG tablet Take 0.5 mg by mouth daily as needed.    ? ?No facility-administered medications prior to visit.  ? ?Review of Systems  ?Constitutional:  Negative for  chills, fever, malaise/fatigue and weight loss.  ?HENT:  Negative for congestion, sinus pain and sore throat.   ?Eyes: Negative.   ?Respiratory:  Positive for cough, shortness of breath and wheezing. Negative for hemoptysis and sputum production.   ?Cardiovascular:  Negative for chest pain, palpitations, orthopnea, claudication and leg swelling.  ?Gastrointestinal:  Negative for abdominal pain, heartburn, nausea and vomiting.  ?Genitourinary: Negative.   ?Musculoskeletal:  Negative for joint pain and myalgias.  ?Skin:  Negative for rash.  ?Neurological:  Negative for weakness.  ?Endo/Heme/Allergies: Negative.   ?Psychiatric/Behavioral:  Positive for depression. The patient is nervous/anxious.    ? ?Objective:  ? ?Vitals:  ? 06/08/21 1343  ?BP: 126/82  ?Pulse: 93  ?SpO2: 98%  ?Weight: 272 lb (123.4 kg)  ?Height: 5\' 11"  (1.803 m)  ? ?Physical Exam ?Constitutional:   ?   General: He is not in acute distress. ?HENT:  ?   Head: Normocephalic and atraumatic.  ?Eyes:  ?   Extraocular Movements: Extraocular movements intact.  ?   Conjunctiva/sclera: Conjunctivae normal.  ?   Pupils: Pupils are equal, round, and reactive to light.  ?Cardiovascular:  ?   Rate and Rhythm: Normal rate and regular rhythm.  ?   Pulses: Normal pulses.  ?   Heart sounds: Normal heart sounds. No murmur heard. ?Pulmonary:  ?   Effort: Pulmonary effort is normal.  ?   Breath sounds: No wheezing, rhonchi or rales.  ?Abdominal:  ?   General: Bowel sounds are normal.  ?   Palpations: Abdomen is soft.  ?Musculoskeletal:  ?   Right lower leg: No edema.  ?   Left lower leg: No edema.  ?Lymphadenopathy:  ?   Cervical: No cervical adenopathy.  ?Skin: ?   General: Skin is warm and dry.  ?Neurological:  ?   General: No focal deficit present.  ?   Mental Status: He is alert.  ?Psychiatric:     ?   Mood and Affect: Mood normal.     ?   Behavior: Behavior normal.     ?   Thought Content: Thought content normal.     ?   Judgment: Judgment normal.  ? ?CBC ?   ?Component Value Date/Time  ? WBC 6.1 02/20/2021 1336  ? RBC 5.12 02/20/2021 1336  ? HGB 15.7 02/20/2021 1336  ? HCT 46.1 02/20/2021 1336  ? PLT 206 02/20/2021 1336  ? MCV 90.0 02/20/2021 1336  ? MCH 30.7 02/20/2021 1336  ? MCHC 34.1 02/20/2021 1336  ? RDW 11.6 02/20/2021 1336  ? ?BMP Latest Ref Rng & Units 02/20/2021  ?Glucose 70 - 99 mg/dL 90  ?BUN 6 - 20 mg/dL 13  ?Creatinine 0.61 - 1.24 mg/dL 1.26(H)  ?Sodium 135 - 145 mmol/L 136  ?Potassium 3.5 - 5.1 mmol/L 3.9  ?Chloride 98 - 111 mmol/L 105  ?CO2 22 - 32 mmol/L 22  ?Calcium 8.9 - 10.3 mg/dL 9.0  ? ?Chest imaging: ?LCS CT Chest 05/18/21 ?Mediastinum/Nodes: No pathologically enlarged mediastinal or ?axillary lymph nodes. Hilar regions are difficult  to definitively ?evaluate without IV contrast. Esophagus is grossly unremarkable. ?  ?Lungs/Pleura: Centrilobular and paraseptal emphysema. Mild smoking ?related respiratory bronchiolitis. Scattered mucoid impaction. ?Subpleural scarring in the lingula. Clustered peribronchovascular ?nodularity in the posteromedial right lower lobe (3/204-212) is ?likely postinfectious in etiology. 6.9 mm subpleural posteromedial ?right lower lobe nodule (3/240). No pleural fluid. Airway is ?unremarkable. ? ?PFT: ?No flowsheet data found. ? ?Labs: ? ?Path: ? ?Echo: ? ?Heart Catheterization: ? ?Assessment &  Plan:  ? ?Mild intermittent reactive airway disease without complication - Plan: Pulmonary Function Test, Ambulatory Referral for DME ? ?Centrilobular emphysema ([place]) ? ?Former cigarette smoker ? ?Discussion: ?Effie Janoski is a 51 year old male, former smoker who is referred to pulmonary clinic for chest congestion. ? ?Patient has known centrilobular emphysematous changes noted on his recent CT chest scan and has history concerning for possible reactive airways disease as well. ? ?He is to continue Symbicort 80-4.5 mcg 2 puffs twice daily instead of 1 puff twice daily.  We will order him a flutter valve he can use twice daily for airway clearance due to his chest congestion.  We can consider nebulizer treatments in the future if needed. ? ?Follow-up in 3 months with pulmonary function test. ? ?Freda Jackson, MD ?[facility] ?Office: [phone] ? ? ?Current Outpatient Medications:  ?  budesonide-formoterol (SYMBICORT) 80-4.5 MCG/ACT inhaler, Inhale 1 puff into the lungs 2 (two) times daily., Disp: , Rfl:  ?  clobetasol ointment (TEMOVATE) 0.05 %, Apply topically 2 (two) times daily., Disp: , Rfl:  ?  COMBIVENT RESPIMAT 20-100 MCG/ACT AERS respimat, Inhale 1 puff into the lungs 4 (four) times daily., Disp: , Rfl:  ?  LORazepam (ATIVAN) 0.5 MG tablet, Take 0.5 mg by mouth daily as needed., Disp: ,  Rfl:  ? ? ?

## 2021-08-26 DIAGNOSIS — J449 Chronic obstructive pulmonary disease, unspecified: Secondary | ICD-10-CM | POA: Insufficient documentation

## 2021-08-26 DIAGNOSIS — E669 Obesity, unspecified: Secondary | ICD-10-CM | POA: Insufficient documentation

## 2021-09-08 ENCOUNTER — Encounter: Payer: Self-pay | Admitting: Pulmonary Disease

## 2021-09-08 ENCOUNTER — Ambulatory Visit (INDEPENDENT_AMBULATORY_CARE_PROVIDER_SITE_OTHER): Payer: BC Managed Care – PPO | Admitting: Pulmonary Disease

## 2021-09-08 ENCOUNTER — Ambulatory Visit: Payer: BC Managed Care – PPO | Admitting: Pulmonary Disease

## 2021-09-08 VITALS — BP 118/84 | HR 92 | Ht 71.5 in | Wt 272.8 lb

## 2021-09-08 DIAGNOSIS — R911 Solitary pulmonary nodule: Secondary | ICD-10-CM | POA: Diagnosis not present

## 2021-09-08 DIAGNOSIS — J4520 Mild intermittent asthma, uncomplicated: Secondary | ICD-10-CM

## 2021-09-08 DIAGNOSIS — J432 Centrilobular emphysema: Secondary | ICD-10-CM

## 2021-09-08 LAB — PULMONARY FUNCTION TEST
DL/VA % pred: 118 %
DL/VA: 5.18 ml/min/mmHg/L
DLCO cor % pred: 123 %
DLCO cor: 37.4 ml/min/mmHg
DLCO unc % pred: 123 %
DLCO unc: 37.4 ml/min/mmHg
FEF 25-75 Post: 2.34 L/sec
FEF 25-75 Pre: 2.02 L/sec
FEF2575-%Change-Post: 15 %
FEF2575-%Pred-Post: 66 %
FEF2575-%Pred-Pre: 57 %
FEV1-%Change-Post: 5 %
FEV1-%Pred-Post: 80 %
FEV1-%Pred-Pre: 76 %
FEV1-Post: 3.27 L
FEV1-Pre: 3.1 L
FEV1FVC-%Change-Post: 1 %
FEV1FVC-%Pred-Pre: 87 %
FEV6-%Change-Post: 3 %
FEV6-%Pred-Post: 91 %
FEV6-%Pred-Pre: 88 %
FEV6-Post: 4.65 L
FEV6-Pre: 4.47 L
FEV6FVC-%Change-Post: 0 %
FEV6FVC-%Pred-Post: 102 %
FEV6FVC-%Pred-Pre: 101 %
FVC-%Change-Post: 3 %
FVC-%Pred-Post: 89 %
FVC-%Pred-Pre: 86 %
FVC-Post: 4.73 L
FVC-Pre: 4.57 L
Post FEV1/FVC ratio: 69 %
Post FEV6/FVC ratio: 98 %
Pre FEV1/FVC ratio: 68 %
Pre FEV6/FVC Ratio: 98 %
RV % pred: 167 %
RV: 3.61 L
TLC % pred: 113 %
TLC: 8.24 L

## 2021-09-08 MED ORDER — TRELEGY ELLIPTA 100-62.5-25 MCG/ACT IN AEPB: 1.0000 | INHALATION_SPRAY | Freq: Every day | RESPIRATORY_TRACT | 0 refills | Status: DC

## 2021-09-08 NOTE — Progress Notes (Signed)
Full PFT Performed Today  

## 2021-09-08 NOTE — Patient Instructions (Signed)
Full PFT Performed Today  

## 2021-09-08 NOTE — Patient Instructions (Addendum)
Try trelegy ellipta 1 puff daily  - rinse mouth out after each use  Stop symbicort inhaler while on trelegy  Continue to use albuterol inhaler as needed 1-2 puffs every 4-6 hours  Your breathing tests show mild obstructive defect consistent with COPD. We will continue to monitor you to determine if there is component of asthma.   Follow up in 6 months

## 2021-09-08 NOTE — Progress Notes (Signed)
Synopsis: Referred in March 2023 for chest congestion  Subjective:   PATIENT ID: [name] [name] GENDER: male DOB: November 27, 1970, MRN: [id number]  HPI  Chief Complaint  Patient presents with   Follow-up    3 mo f/u after PFT. States he has been having more chest tightness. Had a small flare up a few weeks ago.    [name] [name] is a 51 year old male, former smoker who returns to pulmonary clinic for chest congestion.  He was instructed to take symbicort 80-4.24mg 2 puffs twice daily and as needed albuterol. We provided him with a flutter valve for airway clearance.   PFTs today show mild obstructive defect with air trapping.  He was seen by his PCP 08/26/21 for dyspnea which was felt to be related to anxiety.   Initial OV 06/08/21 He has been seen by our lung cancer screening team previously and is being follow for pulmonary nodules. The CT Chest scan also showed emphysema and areas of mucoid impaction.   Patient reports ongoing chest congestion since last fall after experiencing upper respiratory viral infection.  He has intermittent shortness of breath, cough and wheezing.  When he is active he is able to have productive cough which helps him feel better after the mucus is cleared.  He tried Mucinex previously without much relief.  He reports sitting in a reclined position can make his symptoms worse.  He is sleeping well with no nighttime awakenings due to cough.  He started Symbicort 80-4.5 mcg 1 puff twice daily with some improvement in his symptoms.  He is a former smoker and quit in 2015.  He has a 30-pack-year smoking history and is currently undergoing lung cancer screening.  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  He does not report any known asbestos exposure.  He also previously worked as a pAudiological scientist  Past Medical History:  Diagnosis Date   Asthma       No family history on file.   Social History   Socioeconomic History   Marital status: Married    Spouse name: Not on file   Number of children: Not on file   Years of education: Not on file   Highest education level: Not on file  Occupational History   Not on file  Tobacco Use   Smoking status: Former    Packs/day: 1.00    Years: 29.00    Pack years: 29.00    Types: Cigarettes    Quit date: 2015    Years since quitting: 8.4   Smokeless tobacco: Not on file  Substance and Sexual Activity   Alcohol use: Not on file   Drug use: Not on file   Sexual activity: Not on file  Other Topics Concern   Not on file  Social History Narrative   Not on file   Social Determinants of Health   Financial Resource Strain: Not on file  Food Insecurity: Not on file  Transportation Needs: Not on file  Physical Activity: Not on file  Stress: Not on file  Social Connections: Not on file  Intimate Partner Violence: Not on file     No Known Allergies   Outpatient Medications Prior to Visit  Medication Sig Dispense Refill   clobetasol ointment (TEMOVATE) 0.05 % Apply topically 2 (two) times daily.     COMBIVENT RESPIMAT 20-100 MCG/ACT AERS respimat Inhale 1 puff into the  lungs 4 (four) times daily.     fluticasone (FLONASE) 50 MCG/ACT nasal spray Place into the nose.     LORazepam (ATIVAN) 0.5 MG tablet Take 0.5 mg by mouth daily as needed.     budesonide-formoterol (SYMBICORT) 80-4.5 MCG/ACT inhaler Inhale 1 puff into the lungs 2 (two) times daily.     No facility-administered medications prior to visit.   Review of Systems  Constitutional:  Negative for chills, fever, malaise/fatigue and weight loss.  HENT:  Negative for congestion, sinus pain and sore throat.   Eyes: Negative.   Respiratory:  Positive for cough, shortness of breath and wheezing. Negative for hemoptysis and sputum production.   Cardiovascular:  Negative for chest pain, palpitations, orthopnea, claudication and  leg swelling.  Gastrointestinal:  Negative for abdominal pain, heartburn, nausea and vomiting.  Genitourinary: Negative.   Musculoskeletal:  Negative for joint pain and myalgias.  Skin:  Negative for rash.  Neurological:  Negative for weakness.  Endo/Heme/Allergies: Negative.   Psychiatric/Behavioral:  Positive for depression. The patient is nervous/anxious.    Objective:   Vitals:   09/08/21 1046  BP: 118/84  Pulse: 92  SpO2: 96%  Weight: 272 lb 12.8 oz (123.7 kg)  Height: 5' 11.5" (1.816 m)   Physical Exam Constitutional:      General: He is not in acute distress.    Appearance: He is obese.  HENT:     Head: Normocephalic and atraumatic.  Eyes:     Conjunctiva/sclera: Conjunctivae normal.  Cardiovascular:     Rate and Rhythm: Normal rate and regular rhythm.     Pulses: Normal pulses.     Heart sounds: Normal heart sounds. No murmur heard. Pulmonary:     Effort: Pulmonary effort is normal.     Breath sounds: No wheezing, rhonchi or rales.  Musculoskeletal:     Right lower leg: No edema.     Left lower leg: No edema.  Skin:    General: Skin is warm and dry.  Neurological:     General: No focal deficit present.     Mental Status: He is alert.  Psychiatric:        Mood and Affect: Mood normal.        Behavior: Behavior normal.        Thought Content: Thought content normal.        Judgment: Judgment normal.   CBC    Component Value Date/Time   WBC 6.1 02/20/2021 1336   RBC 5.12 02/20/2021 1336   HGB 15.7 02/20/2021 1336   HCT 46.1 02/20/2021 1336   PLT 206 02/20/2021 1336   MCV 90.0 02/20/2021 1336   MCH 30.7 02/20/2021 1336   MCHC 34.1 02/20/2021 1336   RDW 11.6 02/20/2021 1336      Latest Ref Rng & Units 02/20/2021    1:36 PM  BMP  Glucose 70 - 99 mg/dL 90    BUN 6 - 20 mg/dL 13    Creatinine 0.61 - 1.24 mg/dL 1.26    Sodium 135 - 145 mmol/L 136    Potassium 3.5 - 5.1 mmol/L 3.9    Chloride 98 - 111 mmol/L 105    CO2 22 - 32 mmol/L 22    Calcium  8.9 - 10.3 mg/dL 9.0     Chest imaging: LCS CT Chest 05/18/21 Mediastinum/Nodes: No pathologically enlarged mediastinal or axillary lymph nodes. Hilar regions are difficult to definitively evaluate without IV contrast. Esophagus is grossly unremarkable.   Lungs/Pleura: Centrilobular and paraseptal emphysema. Mild  smoking related respiratory bronchiolitis. Scattered mucoid impaction. Subpleural scarring in the lingula. Clustered peribronchovascular nodularity in the posteromedial right lower lobe (3/204-212) is likely postinfectious in etiology. 6.9 mm subpleural posteromedial right lower lobe nodule (3/240). No pleural fluid. Airway is unremarkable.  PFT:    Latest Ref Rng & Units 09/08/2021    9:56 AM  PFT Results  FVC-Pre L 4.57    FVC-Predicted Pre % 86    FVC-Post L 4.73    FVC-Predicted Post % 89    Pre FEV1/FVC % % 68    Post FEV1/FCV % % 69    FEV1-Pre L 3.10    FEV1-Predicted Pre % 76    FEV1-Post L 3.27    DLCO uncorrected ml/min/mmHg 37.40    DLCO UNC% % 123    DLCO corrected ml/min/mmHg 37.40    DLCO COR %Predicted % 123    DLVA Predicted % 118    TLC L 8.24    TLC % Predicted % 113    RV % Predicted % 167      Labs:  Path:  Echo:  Heart Catheterization:  Assessment & Plan:   Centrilobular emphysema ([facility])  Lung nodule  Discussion: [name] [name] is a 51 year old male, former smoker who 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  Patient has known centrilobular emphysematous changes noted on his recent CT chest scan and has history concerning for possible reactive airways disease as well.  Pulmonary function tests show mild obstructive defect with evidence of air trapping.   We will try him on trelegy ellipta 1 puff daily and monitor for any improvement in his symptoms. If he notices benefit then we will send in prescription. He can stop symbicort while on trelegy ellipta.   Follow-up in 6 months  Freda Jackson, MD [facility] Office: [phone]   Current Outpatient Medications:    clobetasol ointment (TEMOVATE) 0.05 %, Apply topically 2 (two) times daily., Disp: , Rfl:    COMBIVENT RESPIMAT 20-100 MCG/ACT AERS respimat, Inhale 1 puff into the lungs 4 (four) times daily., Disp: , Rfl:    fluticasone (FLONASE) 50 MCG/ACT nasal spray, Place into the nose., Disp: , Rfl:    Fluticasone-Umeclidin-Vilant (TRELEGY ELLIPTA) 100-62.5-25 MCG/ACT AEPB, Inhale 1 puff into the lungs daily., Disp: 1 each, Rfl: 0   LORazepam (ATIVAN) 0.5 MG tablet, Take 0.5 mg by mouth daily as needed., Disp: , Rfl:

## 2021-09-12 ENCOUNTER — Encounter: Payer: Self-pay | Admitting: Pulmonary Disease

## 2021-09-19 ENCOUNTER — Other Ambulatory Visit: Payer: Self-pay | Admitting: Pulmonary Disease

## 2021-09-19 MED ORDER — TRELEGY ELLIPTA 100-62.5-25 MCG/ACT IN AEPB: 1.0000 | INHALATION_SPRAY | Freq: Every day | RESPIRATORY_TRACT | 3 refills | Status: DC

## 2021-12-07 ENCOUNTER — Ambulatory Visit
Admission: RE | Admit: 2021-12-07 | Discharge: 2021-12-07 | Disposition: A | Discharge status: Home / Self Care | Payer: BC Managed Care – PPO | Source: Ambulatory Visit | Attending: Acute Care | Admitting: Acute Care

## 2021-12-07 DIAGNOSIS — Z87891 Personal history of nicotine dependence: Secondary | ICD-10-CM

## 2021-12-07 DIAGNOSIS — R911 Solitary pulmonary nodule: Secondary | ICD-10-CM

## 2021-12-08 ENCOUNTER — Other Ambulatory Visit: Payer: Self-pay

## 2021-12-08 DIAGNOSIS — Z87891 Personal history of nicotine dependence: Secondary | ICD-10-CM

## 2021-12-08 DIAGNOSIS — Z122 Encounter for screening for malignant neoplasm of respiratory organs: Secondary | ICD-10-CM

## 2021-12-14 ENCOUNTER — Ambulatory Visit: Payer: BC Managed Care – PPO | Admitting: Pulmonary Disease

## 2021-12-14 ENCOUNTER — Encounter: Payer: Self-pay | Admitting: Pulmonary Disease

## 2021-12-14 VITALS — BP 120/64 | HR 77 | Temp 98.5°F | Ht 71.0 in | Wt 280.8 lb

## 2021-12-14 DIAGNOSIS — J432 Centrilobular emphysema: Secondary | ICD-10-CM | POA: Diagnosis not present

## 2021-12-14 DIAGNOSIS — J4520 Mild intermittent asthma, uncomplicated: Secondary | ICD-10-CM | POA: Diagnosis not present

## 2021-12-14 MED ORDER — MONTELUKAST SODIUM 10 MG PO TABS: 10.0000 mg | ORAL_TABLET | Freq: Every day | ORAL | 11 refills | Status: DC

## 2021-12-14 NOTE — Progress Notes (Signed)
Synopsis: Referred in March 2023 for chest congestion  Subjective:   PATIENT ID: [name] [name] GENDER: male DOB: 04/17/1970, MRN: [id number]  HPI  Chief Complaint  Patient presents with   Follow-up    Follow up for emphysema. Pt states he is feeling pretty good. Sometimes he does feel tightness in his chest but the inhalers help. Pt is on Trelegy and Symbicort daily. And Combivent as needed.    [name] [name] is a 51 year old male, former smoker who 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  He was started on trelegy ellipta 1 puff daily at last visit. He continued symbicort inhaler as well. He has not needed his rescue inhaler. He is coughing up mucous in the mornings. He will have days that he has difficulty clearing mucous/chest congestion.  OV 09/08/21 He was instructed to take symbicort 80-4.17mg 2 puffs twice daily and as needed albuterol. We provided him with a flutter valve for airway clearance.   PFTs today show mild obstructive defect with air trapping.  He was seen by his PCP 08/26/21 for dyspnea which was felt to be related to anxiety.   Initial OV 06/08/21 He has been seen by our lung cancer screening team previously and is being follow for pulmonary nodules. The CT Chest scan also showed emphysema and areas of mucoid impaction.   Patient reports ongoing chest congestion since last fall after experiencing upper respiratory viral infection.  He has intermittent shortness of breath, cough and wheezing.  When he is active he is able to have productive cough which helps him feel better after the mucus is cleared.  He tried Mucinex previously without much relief.  He reports sitting in a reclined position can make his symptoms worse.  He is sleeping well with no nighttime awakenings due to cough.  He started Symbicort 80-4.5 mcg 1 puff twice daily with some improvement in his symptoms.  He is a former smoker and quit in 2015.  He has a 30-pack-year smoking history  and is currently undergoing lung cancer screening.  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  He does not report any known asbestos exposure.  He also previously worked as a pAudiological scientist  Past Medical History:  Diagnosis Date   Asthma      History reviewed. No pertinent family history.   Social History   Socioeconomic History   Marital status: Married    Spouse name: Not on file   Number of children: Not on file   Years of education: Not on file   Highest education level: Not on file  Occupational History   Not on file  Tobacco Use   Smoking status: Former    Packs/day: 1.00    Years: 29.00    Total pack years: 29.00    Types: Cigarettes    Quit date: 2015    Years since quitting: 8.6   Smokeless tobacco: Not on file  Substance and Sexual Activity   Alcohol use: Not on file   Drug use: Not on file   Sexual activity: Not on file  Other Topics Concern   Not on file  Social History Narrative   Not on file   Social Determinants of Health   Financial Resource Strain: Not on file  Food Insecurity: Not on file  Transportation Needs: Not on file  Physical Activity: Not on file  Stress: Not on file  Social Connections: Not on file  Intimate Partner Violence: Not on file     No Known Allergies   Outpatient Medications Prior to Visit  Medication Sig Dispense Refill   clobetasol ointment (TEMOVATE) 0.05 % Apply topically 2 (two) times daily.     COMBIVENT RESPIMAT 20-100 MCG/ACT AERS respimat Inhale 1 puff into the lungs 4 (four) times daily.     fluticasone (FLONASE) 50 MCG/ACT nasal spray Place into the nose.     Fluticasone-Umeclidin-Vilant (TRELEGY ELLIPTA) 100-62.5-25 MCG/ACT AEPB Inhale 1 puff into the lungs daily. 60 each 3   LORazepam (ATIVAN) 0.5 MG tablet Take 0.5 mg by mouth daily as needed.     SYMBICORT 80-4.5 MCG/ACT inhaler Inhale into the  lungs.     No facility-administered medications prior to visit.   Review of Systems  Constitutional:  Negative for chills, fever, malaise/fatigue and weight loss.  HENT:  Negative for congestion, sinus pain and sore throat.   Eyes: Negative.   Respiratory:  Positive for cough and sputum production. Negative for hemoptysis, shortness of breath and wheezing.   Cardiovascular:  Negative for chest pain, palpitations, orthopnea, claudication and leg swelling.  Gastrointestinal:  Negative for abdominal pain, heartburn, nausea and vomiting.  Genitourinary: Negative.   Musculoskeletal:  Negative for joint pain and myalgias.  Skin:  Negative for rash.  Neurological:  Negative for weakness.  Endo/Heme/Allergies: Negative.   Psychiatric/Behavioral:  Positive for depression. The patient is nervous/anxious.     Objective:   Vitals:   12/14/21 0859  BP: 120/64  Pulse: 77  Temp: 98.5 F (36.9 C)  TempSrc: Oral  SpO2: 97%  Weight: 280 lb 12.8 oz (127.4 kg)  Height: '5\' 11"'$  (1.803 m)   Physical Exam Constitutional:      General: He is not in acute distress.    Appearance: He is obese.  HENT:     Head: Normocephalic and atraumatic.  Cardiovascular:     Rate and Rhythm: Normal rate and regular rhythm.     Pulses: Normal pulses.     Heart sounds: Normal heart sounds. No murmur heard. Pulmonary:     Effort: Pulmonary effort is normal.     Breath sounds: No wheezing, rhonchi or rales.  Musculoskeletal:     Right lower leg: No edema.     Left lower leg: No edema.  Skin:    General: Skin is warm and dry.  Neurological:     General: No focal deficit present.     Mental Status: He is alert.  Psychiatric:        Mood and Affect: Mood normal.        Behavior: Behavior normal.        Thought Content: Thought content normal.        Judgment: Judgment normal.    CBC    Component Value Date/Time   WBC 6.1 02/20/2021 1336   RBC 5.12 02/20/2021 1336   HGB 15.7 02/20/2021 1336   HCT  46.1 02/20/2021 1336   PLT 206 02/20/2021 1336   MCV 90.0 02/20/2021 1336   MCH 30.7 02/20/2021 1336   MCHC 34.1 02/20/2021 1336   RDW 11.6 02/20/2021 1336      Latest Ref Rng & Units 02/20/2021    1:36 PM  BMP  Glucose 70 - 99 mg/dL 90   BUN 6 - 20 mg/dL 13   Creatinine 0.61 - 1.24 mg/dL 1.26   Sodium 135 - 145 mmol/L 136   Potassium 3.5 - 5.1 mmol/L 3.9  Chloride 98 - 111 mmol/L 105   CO2 22 - 32 mmol/L 22   Calcium 8.9 - 10.3 mg/dL 9.0    Chest imaging: [facility] CT Chest 05/18/21 Mediastinum/Nodes: No pathologically enlarged mediastinal or axillary lymph nodes. Hilar regions are difficult to definitively evaluate without IV contrast. Esophagus is grossly unremarkable.   Lungs/Pleura: Centrilobular and paraseptal emphysema. Mild smoking related respiratory bronchiolitis. Scattered mucoid impaction. Subpleural scarring in the lingula. Clustered peribronchovascular nodularity in the posteromedial right lower lobe (3/204-212) is likely postinfectious in etiology. 6.9 mm subpleural posteromedial right lower lobe nodule (3/240). No pleural fluid. Airway is unremarkable.  PFT:    Latest Ref Rng & Units 09/08/2021    9:56 AM  PFT Results  FVC-Pre L 4.57   FVC-Predicted Pre % 86   FVC-Post L 4.73   FVC-Predicted Post % 89   Pre FEV1/FVC % % 68   Post FEV1/FCV % % 69   FEV1-Pre L 3.10   FEV1-Predicted Pre % 76   FEV1-Post L 3.27   DLCO uncorrected ml/min/mmHg 37.40   DLCO UNC% % 123   DLCO corrected ml/min/mmHg 37.40   DLCO COR %Predicted % 123   DLVA Predicted % 118   TLC L 8.24   TLC % Predicted % 113   RV % Predicted % 167     Labs:  Path:  Echo:  Heart Catheterization:  Assessment & Plan:   Centrilobular emphysema (HCC)  Mild intermittent reactive airway disease without complication - Plan: montelukast (SINGULAIR) 10 MG tablet  Discussion: [name] [name] is a 51 year old male, former smoker who 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  Pulmonary  function tests show mild obstructive defect with evidence of air trapping.   He is to continue trelegy ellipta 1 puff daily. He is to stop symbicort inhaler.  He can use albuterol inhaler as needed. We will start him on montelukast '10mg'$  daily for congestion/cough. He is to use flonase 1-2 sprays per day for nasal congestion.  Follow-up in 1 year.  Freda Jackson, MD [facility] Office: [phone]   Current Outpatient Medications:    clobetasol ointment (TEMOVATE) 0.05 %, Apply topically 2 (two) times daily., Disp: , Rfl:    COMBIVENT RESPIMAT 20-100 MCG/ACT AERS respimat, Inhale 1 puff into the lungs 4 (four) times daily., Disp: , Rfl:    fluticasone (FLONASE) 50 MCG/ACT nasal spray, Place into the nose., Disp: , Rfl:    Fluticasone-Umeclidin-Vilant (TRELEGY ELLIPTA) 100-62.5-25 MCG/ACT AEPB, Inhale 1 puff into the lungs daily., Disp: 60 each, Rfl: 3   LORazepam (ATIVAN) 0.5 MG tablet, Take 0.5 mg by mouth daily as needed., Disp: , Rfl:    montelukast (SINGULAIR) 10 MG tablet, Take 1 tablet (10 mg total) by mouth at bedtime., Disp: 30 tablet, Rfl: 11   SYMBICORT 80-4.5 MCG/ACT inhaler, Inhale into the lungs., Disp: , Rfl:

## 2021-12-14 NOTE — Patient Instructions (Addendum)
Stop Symbicort inhaler as this is overlap therapy with the trelegy.   Continue trelegy ellipta 1 puff daily  Start montelukast '10mg'$  each evening for congestion  Use flonase nasal spray 1-2 sprays as needed for nasal congestion.   Follow up in 1 year

## 2021-12-17 ENCOUNTER — Encounter: Payer: Self-pay | Admitting: Pulmonary Disease

## 2021-12-24 IMAGING — CR DG CHEST 2V
2 series · 2 of 2 positions shown · non-contrast
Comparison: None.

CLINICAL DATA: Chest pain

EXAM:
CHEST - 2 VIEW

[chest pa]
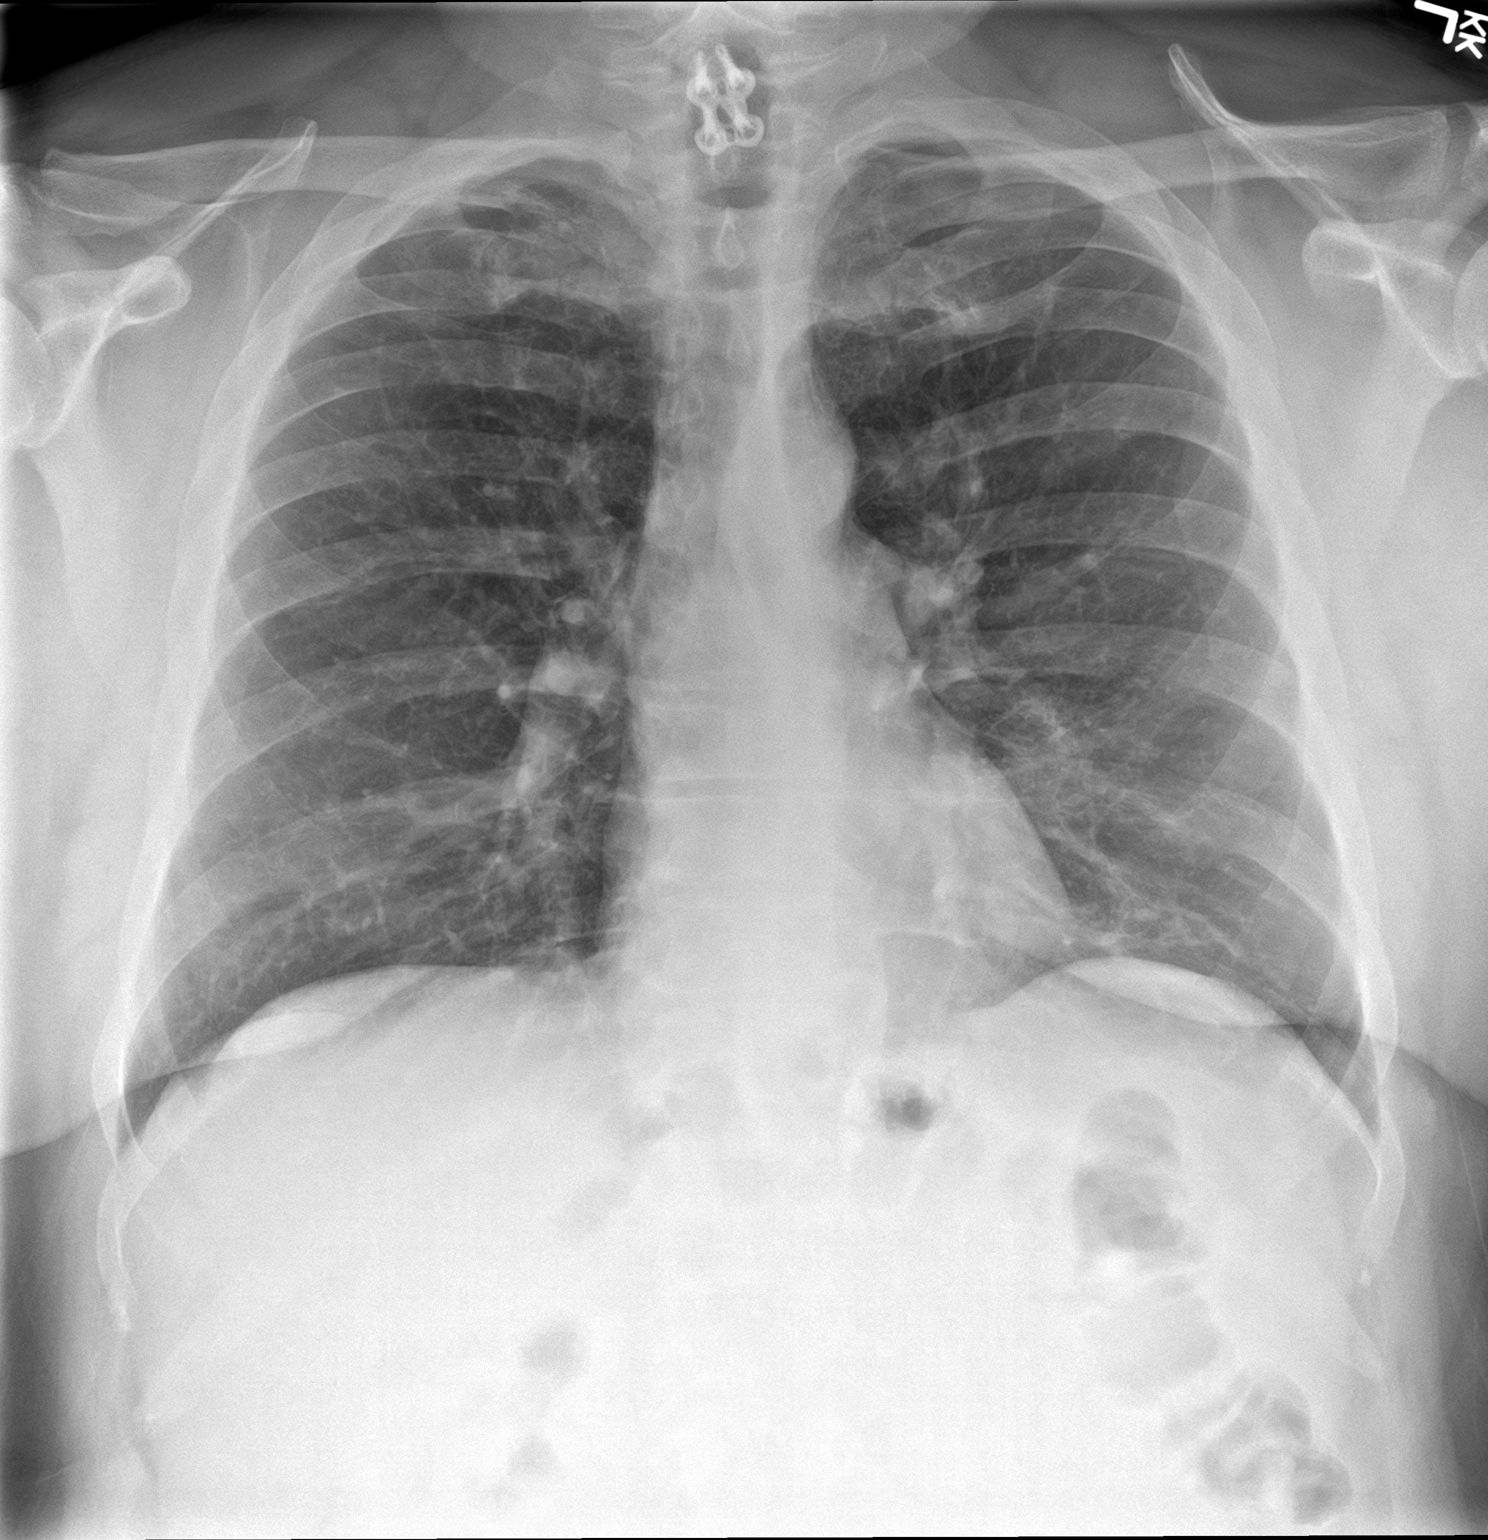

[chest lat]
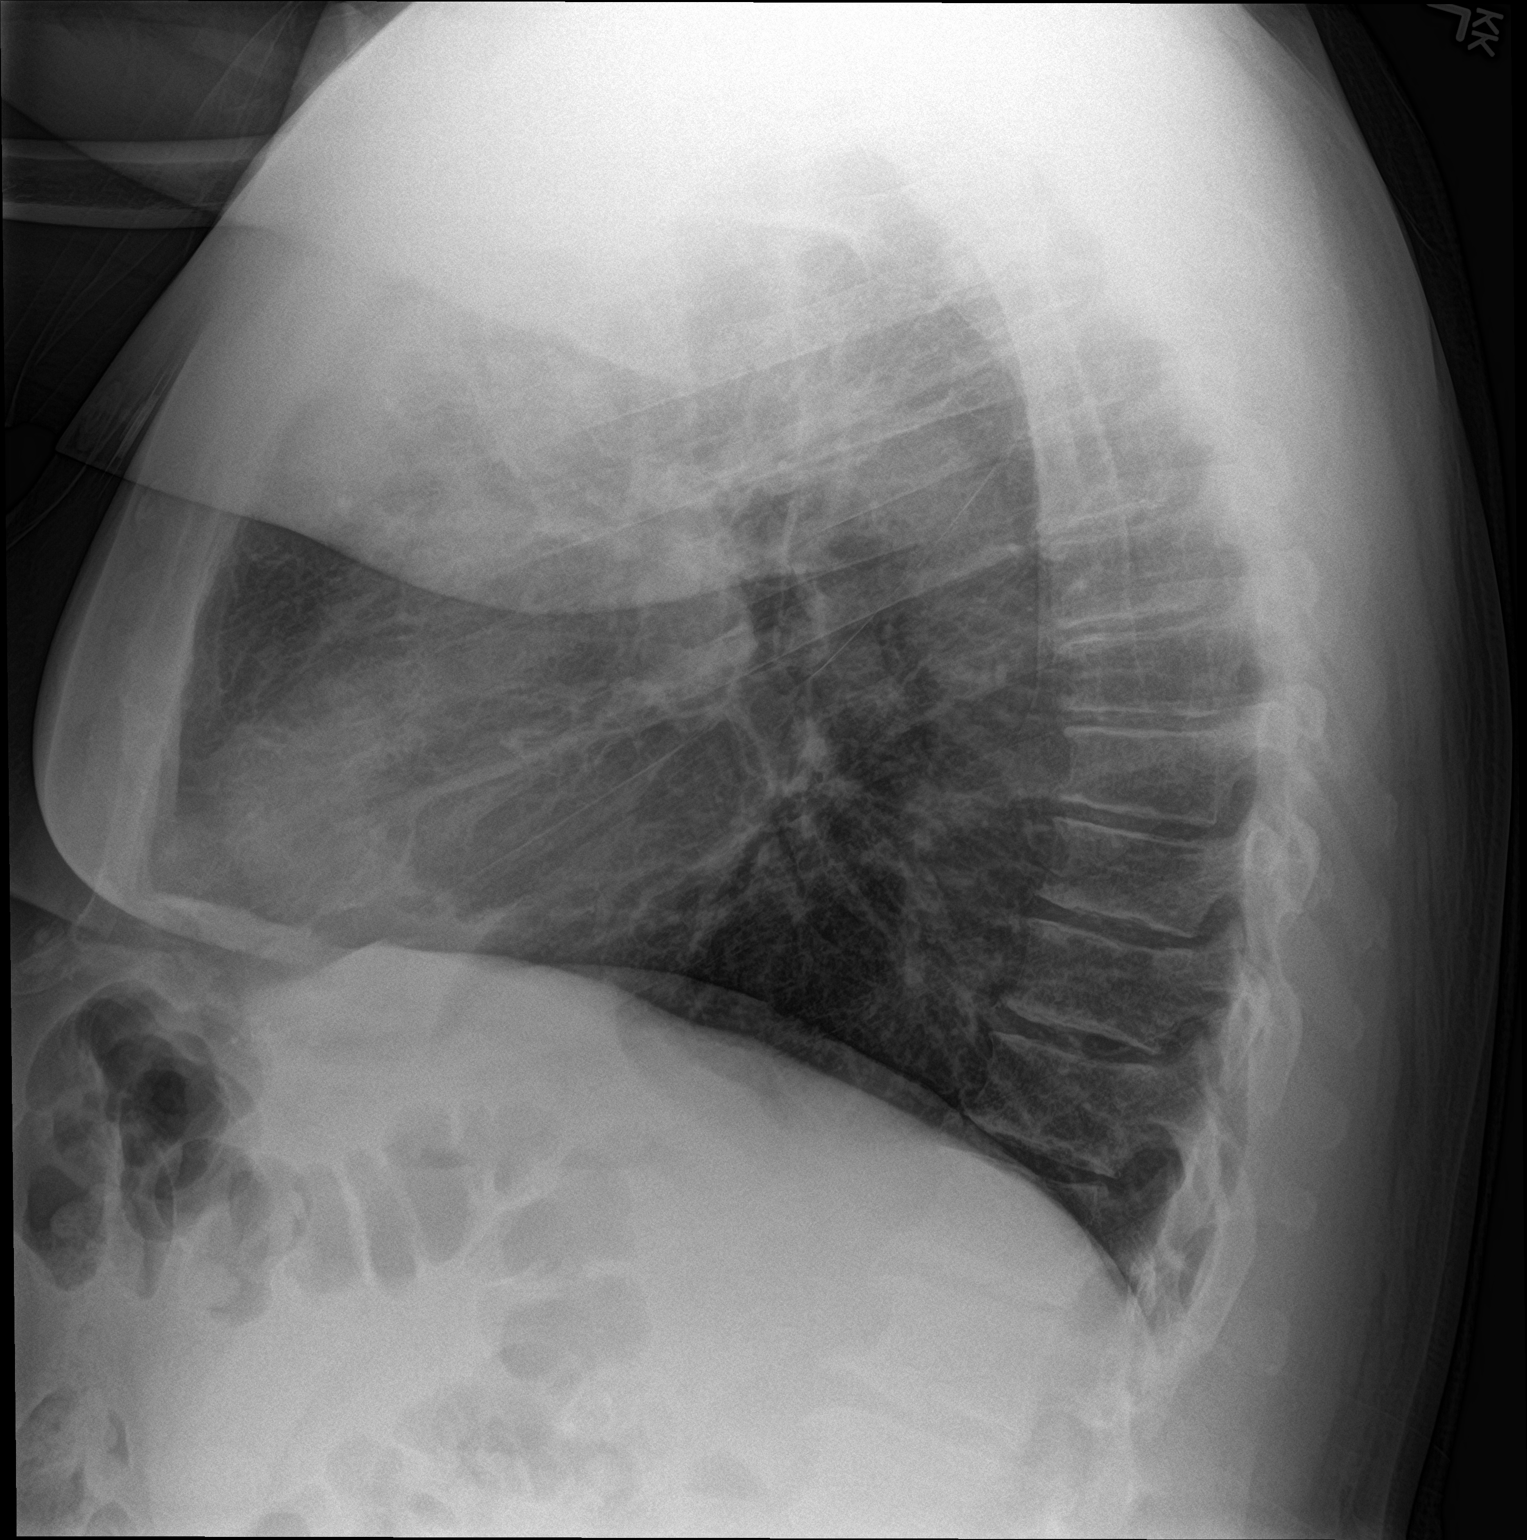

[2 of 2 positions shown; findings below may reference images not displayed]

FINDINGS: The heart size and mediastinal contours are within normal limits.
Mild left basilar opacity, likely due to atelectasis. Lungs
otherwise clear. The visualized skeletal structures are
unremarkable.
IMPRESSION: Mild left basilar opacity, likely due to atelectasis. Lungs
otherwise clear.

## 2022-01-17 ENCOUNTER — Ambulatory Visit
Admission: EM | Admit: 2022-01-17 | Discharge: 2022-01-17 | Disposition: A | Discharge status: Home / Self Care | Payer: BC Managed Care – PPO

## 2022-01-17 DIAGNOSIS — M25562 Pain in left knee: Secondary | ICD-10-CM | POA: Diagnosis not present

## 2022-01-17 NOTE — ED Provider Notes (Signed)
[facility]    CSN: [id number] Arrival date & time: 01/17/22  1025      History   Chief Complaint Chief Complaint  Patient presents with   Knee Pain    Entered by patient    HPI [name] [name] is a 51 y.o. male.    Knee Pain   Patient presents to urgent care with complaint of left knee swelling and pain.  He states he he incurred an injury on Sunday while walking backward dragging a garbage can.  Stepped into a ditch and felt immediate pain which worsened that night and next day.  Has been using hot/cold therapy without relief.  Endorses instability and pain in the leg.  Past Medical History:  Diagnosis Date   Asthma     Patient Active Problem List   Diagnosis Date Noted   Chronic obstructive pulmonary disease ([place]) 08/26/2021   Obesity (BMI 35.0-39.9 without comorbidity) 08/26/2021    History reviewed. No pertinent surgical history.     Home Medications    Prior to Admission medications   Medication Sig Start Date End Date Taking? Authorizing Provider  cephALEXin (KEFLEX) 500 MG capsule Take 1 capsule every 8 hours by oral route for 10 days. 12/04/14  Yes [provider]  LORazepam (ATIVAN) 0.5 MG tablet Take 1/2-1 tab as needed for acute anxiety 03/10/21  Yes [provider]  omeprazole (PRILOSEC OTC) 20 MG tablet Take 1 tablet by mouth 2 (two) times daily. 10/26/21  Yes [provider]  azithromycin (ZITHROMAX) 250 MG tablet Take 250 mg by mouth as directed. 08/11/21   [provider]  clobetasol ointment (TEMOVATE) 0.05 % Apply topically 2 (two) times daily. 01/03/21   [provider]  COMBIVENT RESPIMAT 20-100 MCG/ACT AERS respimat Inhale 1 puff into the lungs 4 (four) times daily. 05/15/21   [provider]  fluticasone (FLONASE) 50 MCG/ACT nasal spray Place into the nose. 08/11/21 08/11/22  [provider]  Fluticasone-Umeclidin-Vilant (TRELEGY ELLIPTA) 100-62.5-25 MCG/ACT AEPB Inhale 1  puff into the lungs daily. 09/19/21   Freddi Starr, MD  LORazepam (ATIVAN) 0.5 MG tablet Take 0.5 mg by mouth daily as needed. 03/10/21   [provider]  montelukast (SINGULAIR) 10 MG tablet Take 1 tablet (10 mg total) by mouth at bedtime. 12/14/21   Freddi Starr, MD  predniSONE (DELTASONE) 10 MG tablet Take 10 mg by mouth daily. 08/11/21   [provider]  SYMBICORT 80-4.5 MCG/ACT inhaler Inhale into the lungs. 12/13/21   [provider]    Family History History reviewed. No pertinent family history.  Social History Social History   Tobacco Use   Smoking status: Former    Packs/day: 1.00    Years: 29.00    Total pack years: 29.00    Types: Cigarettes    Quit date: 2015    Years since quitting: 8.7     Allergies   Patient has no known allergies.   Review of Systems Review of Systems   Physical Exam Triage Vital Signs ED Triage Vitals  Enc Vitals Group     BP 01/17/22 1143 (!) 149/94     Pulse Rate 01/17/22 1143 (!) 111     Resp 01/17/22 1143 18     Temp 01/17/22 1143 98.1 F (36.7 C)     Temp src --      SpO2 01/17/22 1143 93 %     Weight --      Height --  Head Circumference --      Peak Flow --      Pain Score 01/17/22 1144 5     Pain Loc --      Pain Edu? --      Excl. in [place]? --    No data found.  Updated Vital Signs BP (!) 149/94   Pulse (!) 111   Temp 98.1 F (36.7 C)   Resp 18   SpO2 93%   Visual Acuity Right Eye Distance:   Left Eye Distance:   Bilateral Distance:    Right Eye Near:   Left Eye Near:    Bilateral Near:     Physical Exam Vitals reviewed.  Constitutional:      Appearance: Normal appearance.  Skin:    General: Skin is warm and dry.  Neurological:     General: No focal deficit present.     Mental Status: He is alert and oriented to person, place, and time.  Psychiatric:        Mood and Affect: Mood normal.        Behavior: Behavior normal.      UC Treatments / Results   Labs (all labs ordered are listed, but only abnormal results are displayed) Labs Reviewed - No data to display  EKG   Radiology No results found.  Procedures Procedures (including critical care time)  Medications Ordered in UC Medications - No data to display  Initial Impression / Assessment and Plan / UC Course  I have reviewed the triage vital signs and the nursing notes.  Pertinent labs & imaging results that were available during my care of the patient were reviewed by me and considered in my medical decision making (see chart for details).   Encourage patient to seek orthopedic evaluation.  [facility] is "on-call" today.   Final Clinical Impressions(s) / UC Diagnoses   Final diagnoses:  Acute pain of left knee     Discharge Instructions      Recommended patient seek orthopedic evaluation at the clinic of his choice, [facility] versus [facility].   ED Prescriptions   None    PDMP not reviewed this encounter.   Rose Phi, [place] 01/17/22 1203

## 2022-01-17 NOTE — Discharge Instructions (Addendum)
Recommended patient seek orthopedic evaluation at the clinic of his choice, [facility] versus [facility].

## 2022-01-17 NOTE — ED Triage Notes (Signed)
Pt. Is c/o left knee swelling and pain. Pain states Sunday he stepped into a ditch and has been feeling pain and swelling ever since. Pt. Has been using hot and cold therapy w/ no relief.

## 2022-01-29 ENCOUNTER — Other Ambulatory Visit: Payer: Self-pay | Admitting: Pulmonary Disease

## 2022-02-01 ENCOUNTER — Ambulatory Visit: Payer: BC Managed Care – PPO | Admitting: Nurse Practitioner

## 2022-02-08 ENCOUNTER — Encounter: Payer: Self-pay | Admitting: Nurse Practitioner

## 2022-02-08 ENCOUNTER — Ambulatory Visit: Payer: BC Managed Care – PPO | Admitting: Nurse Practitioner

## 2022-02-08 VITALS — BP 128/80 | HR 73 | Ht 71.0 in | Wt 286.0 lb

## 2022-02-08 DIAGNOSIS — J432 Centrilobular emphysema: Secondary | ICD-10-CM | POA: Diagnosis not present

## 2022-02-08 DIAGNOSIS — R600 Localized edema: Secondary | ICD-10-CM

## 2022-02-08 DIAGNOSIS — E669 Obesity, unspecified: Secondary | ICD-10-CM

## 2022-02-08 DIAGNOSIS — R0602 Shortness of breath: Secondary | ICD-10-CM | POA: Diagnosis not present

## 2022-02-08 LAB — BASIC METABOLIC PANEL
BUN: 14 mg/dL (ref 6–23)
CO2: 26 mEq/L (ref 19–32)
Calcium: 8.9 mg/dL (ref 8.4–10.5)
Chloride: 104 mEq/L (ref 96–112)
Creatinine, Ser: 1.21 mg/dL (ref 0.40–1.50)
GFR: 68.68 mL/min (ref >60.00)
Glucose, Bld: 110 mg/dL — ABNORMAL HIGH (ref 70–99)
Potassium: 4 mEq/L (ref 3.5–5.1)
Sodium: 137 mEq/L (ref 135–145)

## 2022-02-08 LAB — BRAIN NATRIURETIC PEPTIDE: Pro B Natriuretic peptide (BNP): 9 pg/mL (ref 0.0–100.0)

## 2022-02-08 MED ORDER — ALBUTEROL SULFATE HFA 108 (90 BASE) MCG/ACT IN AERS: 2.0000 | INHALATION_SPRAY | Freq: Four times a day (QID) | RESPIRATORY_TRACT | 6 refills | Status: DC | PRN

## 2022-02-08 NOTE — Patient Instructions (Addendum)
-  Continue Trelegy 1 puff daily. Brush tongue and rinse mouth afterwards -Albuterol inhaler 2 puffs every 6 hours as needed for shortness of breath or wheezing. Notify if symptoms persist despite rescue inhaler/neb use.  -Continue singulair 1 tab At bedtime  -continue flonase 2 sprays each nostril daily as needed for allergies/nasal congestion   Mucinex [phone] mg daily as needed for chest congestion  Referral to medical weight management  Echocardiogram ordered today for further evaluation of your leg swelling - someone will contact you for scheduling   Labs - BNP and BMET  Wear compression stockings/TED hose when you are at work; elevate feet at the end of the day or during breaks, if able  Follow up in 4 months with Dr. Erin [name] or [place]. If symptoms worsen, please contact office for sooner follow up or seek emergency care.

## 2022-02-08 NOTE — Progress Notes (Signed)
$'@Patient'X$  ID: [name] [name], male    DOB: 1970-04-22, 51 y.o.   MRN: [id number]  Chief Complaint  Patient presents with   Follow-up    Pt f/u he is feeling okay now but when he made appt he was having problems with chest congestion "gurgling" and increased SOB, he does report he went to the ER for another problem and his O2 was at 90%    Referring provider: [facility]  HPI: 51 year old male, former smoker followed for emphysema and mild reactive airway disease. He is a patient of Dr. August Albino and last seen in office 12/14/2021. Past medical history significant for obesity.  TEST/EVENTS:   12/14/2021: OV with Dr. Erin Fulling. Productive AM cough. Occasional days where he has difficulty clearing mucous/chest congestion. Started on Trelegy in June; PFTs showed mild obstruction with air trapping. Added singulair. Encouraged him to use flonase for nasal congestion.   02/08/2022: Today - acute Patient presents today for acute visit.  When he initially made this appointment, he was struggling with fatigue and a lot of chest congestion and increased cough.  Since then, he has started to feel better.  Feels like he is mostly back to his baseline.  Tells me that he actually feels pretty good today.  No significant congestion or cough.  Breathing is at his baseline.  He does feel he gets good benefit from the Trelegy.  His main concern today is that he has been struggling with lower extremity swelling for the past 2 months now. He denies any orthopnea, PND, palpitations, dizziness, lightheadedness.  Worse in the evenings, especially after working all day.  He is on his feet and walks around 17,000 steps a day.  He does also think that it could be related to his weight.  He is the heaviest that he has ever been.  He would like to work on weight loss measures.  It is hard for him with his work schedule and so he commonly eats fast food.  He did just recently buy some equipment to start increasing  his activity level at home.  No Known Allergies  Immunization History  Administered Date(s) Administered   Influenza Inj Mdck Quad Pf 02/25/2021    Past Medical History:  Diagnosis Date   Asthma     Tobacco History: Social History   Tobacco Use  Smoking Status Former   Packs/day: 1.00   Years: 29.00   Total pack years: 29.00   Types: Cigarettes   Quit date: 2015   Years since quitting: 8.8  Smokeless Tobacco Not on file   Counseling given: Not Answered   Outpatient Medications Prior to Visit  Medication Sig Dispense Refill   cephALEXin (KEFLEX) 500 MG capsule Take 1 capsule every 8 hours by oral route for 10 days.     clobetasol ointment (TEMOVATE) 0.05 % Apply topically 2 (two) times daily.     fluticasone (FLONASE) 50 MCG/ACT nasal spray Place into the nose.     LORazepam (ATIVAN) 0.5 MG tablet Take 0.5 mg by mouth daily as needed.     LORazepam (ATIVAN) 0.5 MG tablet Take 1/2-1 tab as needed for acute anxiety     montelukast (SINGULAIR) 10 MG tablet Take 1 tablet (10 mg total) by mouth at bedtime. 30 tablet 11   omeprazole (PRILOSEC OTC) 20 MG tablet Take 1 tablet by mouth 2 (two) times daily.     TRELEGY ELLIPTA 100-62.5-25 MCG/ACT AEPB INHALE ONE PUFF INTO THE LUNGS DAILY 60 each  3   COMBIVENT RESPIMAT 20-100 MCG/ACT AERS respimat Inhale 1 puff into the lungs 4 (four) times daily.     azithromycin (ZITHROMAX) 250 MG tablet Take 250 mg by mouth as directed.     predniSONE (DELTASONE) 10 MG tablet Take 10 mg by mouth daily.     No facility-administered medications prior to visit.     Review of Systems:   Constitutional: No weight loss or gain, night sweats, fevers, chills, or lassitude. +fatigue (improved) HEENT: No headaches, difficulty swallowing, tooth/dental problems, or sore throat. No sneezing, itching, ear ache, nasal congestion, or post nasal drip CV:  +swelling in lower extremities. No chest pain, orthopnea, PND, anasarca, dizziness, palpitations,  syncope Resp: +shortness of breath with exertion (baseline); occasional chest congestion; AM cough. No excess mucus or change in color of mucus. No productive or non-productive. No hemoptysis. No wheezing.  No chest wall deformity GI:  No heartburn, indigestion GU: No dysuria, change in color of urine, urgency or frequency.   MSK:  No joint pain or swelling.  No decreased range of motion.  No back pain. Neuro: No dizziness or lightheadedness.  Psych: No depression or anxiety. Mood stable.     Physical Exam:  BP 128/80   Pulse 73   Ht '5\' 11"'$  (1.803 m)   Wt 286 lb (129.7 kg)   SpO2 96%   BMI 39.89 kg/m   GEN: Pleasant, interactive, well-appearing; obese; in no acute distress HEENT:  Normocephalic and atraumatic. PERRLA. Sclera white. Nasal turbinates pink, moist and patent bilaterally. No rhinorrhea present. Oropharynx pink and moist, without exudate or edema. No lesions, ulcerations, or postnasal drip.  NECK:  Supple w/ fair ROM. No JVD present. Normal carotid impulses w/o bruits. Thyroid symmetrical with no goiter or nodules palpated. No lymphadenopathy.   CV: RRR, no m/r/g, no peripheral edema. Pulses intact, +2 bilaterally. No cyanosis, pallor or clubbing. PULMONARY:  Unlabored, regular breathing. Clear bilaterally A&P w/o wheezes/rales/rhonchi. No accessory muscle use.  GI: BS present and normoactive. Soft, non-tender to palpation. No organomegaly or masses detected.  MSK: No erythema, warmth or tenderness. No deformities or joint swelling noted.  Neuro: A/Ox3. No focal deficits noted.   Skin: Warm, no lesions or rashe Psych: Normal affect and behavior. Judgement and thought content appropriate.     Lab Results:  CBC    Component Value Date/Time   WBC 6.1 02/20/2021 1336   RBC 5.12 02/20/2021 1336   HGB 15.7 02/20/2021 1336   HCT 46.1 02/20/2021 1336   PLT 206 02/20/2021 1336   MCV 90.0 02/20/2021 1336   MCH 30.7 02/20/2021 1336   MCHC 34.1 02/20/2021 1336   RDW 11.6  02/20/2021 1336    BMET    Component Value Date/Time   NA 137 02/08/2022 1050   K 4.0 02/08/2022 1050   CL 104 02/08/2022 1050   CO2 26 02/08/2022 1050   GLUCOSE 110 (H) 02/08/2022 1050   BUN 14 02/08/2022 1050   CREATININE 1.21 02/08/2022 1050   CALCIUM 8.9 02/08/2022 1050   GFRNONAA >60 02/20/2021 1336    BNP No results found for: "BNP"   Imaging:  No results found.       Latest Ref Rng & Units 09/08/2021    9:56 AM  PFT Results  FVC-Pre L 4.57   FVC-Predicted Pre % 86   FVC-Post L 4.73   FVC-Predicted Post % 89   Pre FEV1/FVC % % 68   Post FEV1/FCV % % 69   FEV1-Pre L  3.10   FEV1-Predicted Pre % 76   FEV1-Post L 3.27   DLCO uncorrected ml/min/mmHg 37.40   DLCO UNC% % 123   DLCO corrected ml/min/mmHg 37.40   DLCO COR %Predicted % 123   DLVA Predicted % 118   TLC L 8.24   TLC % Predicted % 113   RV % Predicted % 167     No results found for: "NITRICOXIDE"      Assessment & Plan:   Chronic obstructive pulmonary disease (HCC) Mild obstruction with emphysema and reactive airway. Possible recent viral illness vs mild flare. Symptoms are resolved now and back to his baseline. Maintained on Trelegy. Encouraged him to try mucinex if he has trouble with chest congestion and use albuterol as needed to help with mucous clearance. He will let us know if symptoms return.  Patient Instructions  -Continue Trelegy 1 puff daily. Brush tongue and rinse mouth afterwards -Albuterol inhaler 2 puffs every 6 hours as needed for shortness of breath or wheezing. Notify if symptoms persist despite rescue inhaler/neb use.  -Continue singulair 1 tab At bedtime  -continue flonase 2 sprays each nostril daily as needed for allergies/nasal congestion   Mucinex [phone] mg daily as needed for chest congestion  Referral to medical weight management  Echocardiogram ordered today for further evaluation of your leg swelling - someone will contact you for scheduling   Labs - BNP  and BMET  Wear compression stockings/TED hose when you are at work; elevate feet at the end of the day or during breaks, if able  Follow up in 4 months with Dr. Erin Fulling or [place]. If symptoms worsen, please contact office for sooner follow up or seek emergency care.     Bilateral lower extremity edema Intermittent BLE edema; usually at the end of the day after being on his feet. Likely dependent edema from venous insufficiency. He has not had further workup completed. He's euvolemic on exam. We will check BMET and BNP today. Plan for echo for further evaluation. Advised him to use compression stockings in interim, especially when working.  Obesity (BMI 35.0-39.9 without comorbidity) BMI 39. Healthy weight loss encouraged. Referred to medical weight management.    I spent 35 minutes of dedicated to the care of this patient on the date of this encounter to include pre-visit review of records, face-to-face time with the patient discussing conditions above, post visit ordering of testing, clinical documentation with the electronic health record, making appropriate referrals as documented, and communicating necessary findings to members of the patients care team.  Clayton Bibles, NP 02/08/2022  Pt aware and understands NP's role.

## 2022-02-08 NOTE — Assessment & Plan Note (Signed)
Intermittent BLE edema; usually at the end of the day after being on his feet. Likely dependent edema from venous insufficiency. He has not had further workup completed. He's euvolemic on exam. We will check BMET and BNP today. Plan for echo for further evaluation. Advised him to use compression stockings in interim, especially when working.

## 2022-02-08 NOTE — Assessment & Plan Note (Signed)
Mild obstruction with emphysema and reactive airway. Possible recent viral illness vs mild flare. Symptoms are resolved now and back to his baseline. Maintained on Trelegy. Encouraged him to try mucinex if he has trouble with chest congestion and use albuterol as needed to help with mucous clearance. He will let us know if symptoms return.  Patient Instructions  -Continue Trelegy 1 puff daily. Brush tongue and rinse mouth afterwards -Albuterol inhaler 2 puffs every 6 hours as needed for shortness of breath or wheezing. Notify if symptoms persist despite rescue inhaler/neb use.  -Continue singulair 1 tab At bedtime  -continue flonase 2 sprays each nostril daily as needed for allergies/nasal congestion   Mucinex [phone] mg daily as needed for chest congestion  Referral to medical weight management  Echocardiogram ordered today for further evaluation of your leg swelling - someone will contact you for scheduling   Labs - BNP and BMET  Wear compression stockings/TED hose when you are at work; elevate feet at the end of the day or during breaks, if able  Follow up in 4 months with Dr. Erin Fulling or [place]. If symptoms worsen, please contact office for sooner follow up or seek emergency care.

## 2022-02-08 NOTE — Assessment & Plan Note (Signed)
BMI 39. Healthy weight loss encouraged. Referred to medical weight management.

## 2022-02-15 NOTE — Progress Notes (Signed)
Labs nl. Thanks

## 2022-02-20 ENCOUNTER — Encounter (INDEPENDENT_AMBULATORY_CARE_PROVIDER_SITE_OTHER): Payer: Self-pay

## 2022-03-21 IMAGING — CT CT CHEST LUNG CANCER SCREENING LOW DOSE W/O CM
1 of 2 series · 15 of 33 positions shown, 19 images · non-contrast
Comparison: None.

CLINICAL DATA: Former smoker, quit 8 years ago, 20 pack-year
history.



[Series 4: chest 1mm/super d · axial · 0.78mm/px · z∈[-335,-64]mm · 15 of 331 slices shown, 19 images]
[im 15/331  mediastinal]
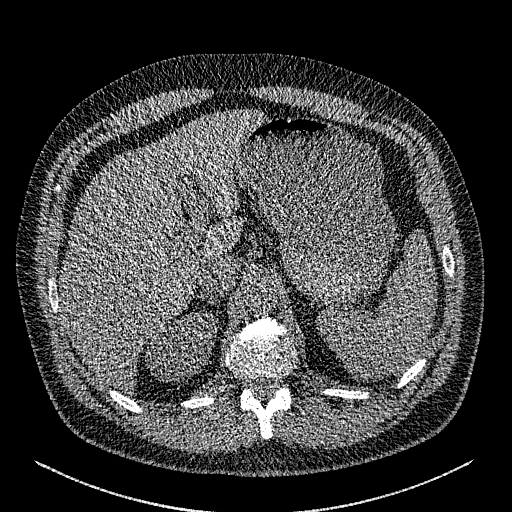
[im 15/331  lung]
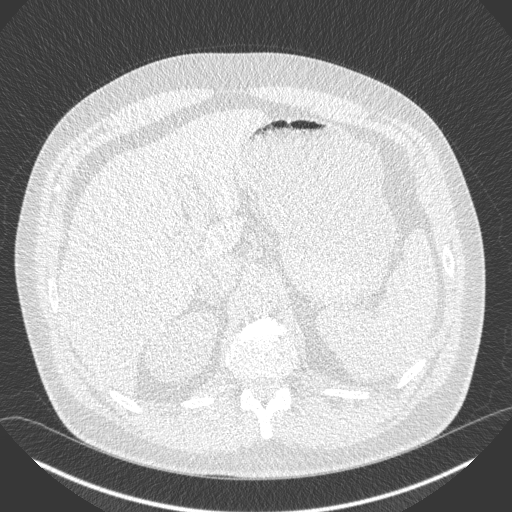
[im 44/331  lung]
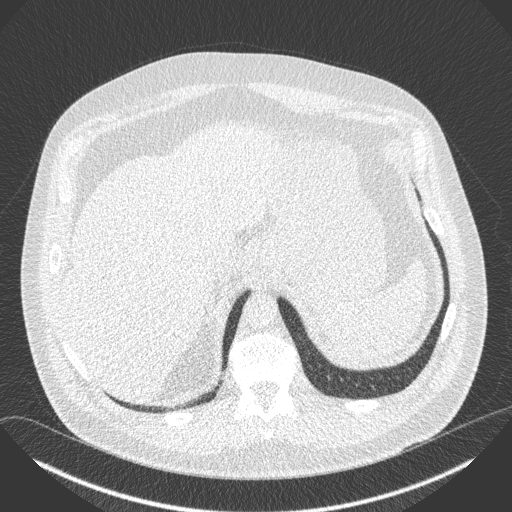
[im 72/331  lung]
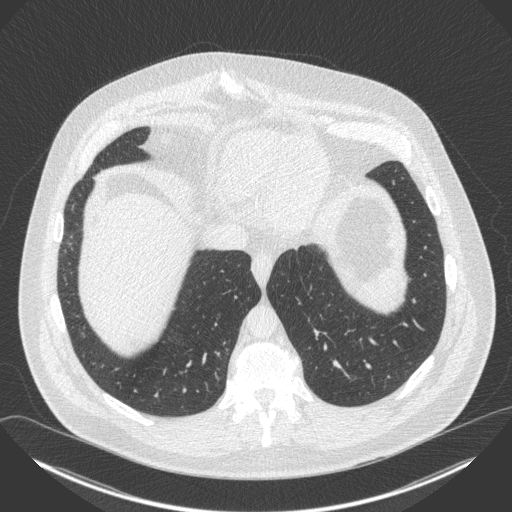
[im 87/331  lung]
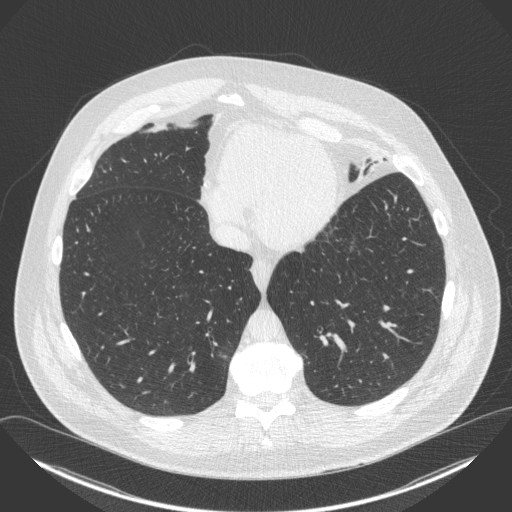
[im 101/331  mediastinal]
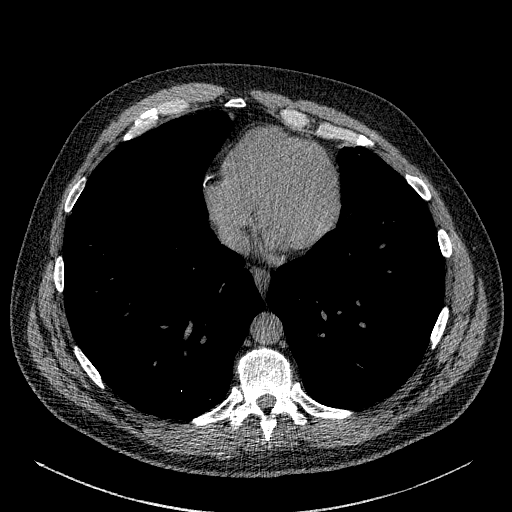
[im 101/331  lung]
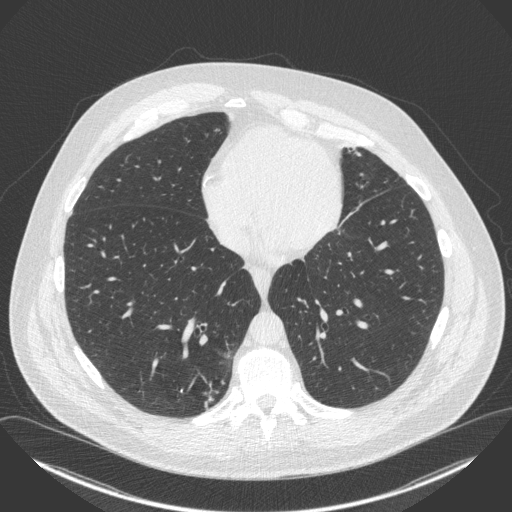
[im 130/331  lung]
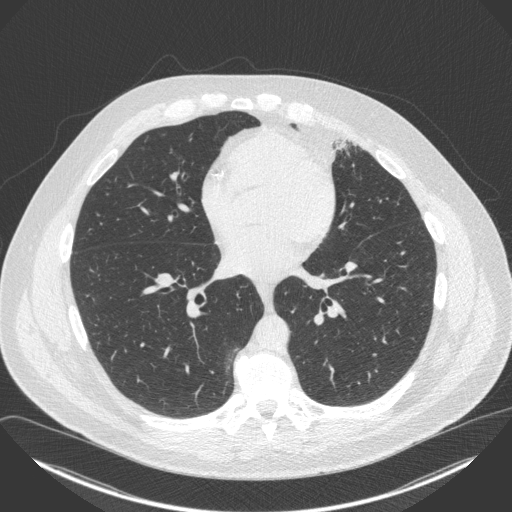
[im 155/331  lung]
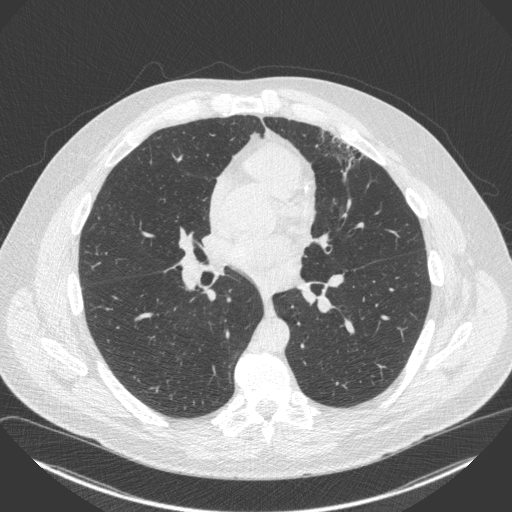
[im 166/331  lung]
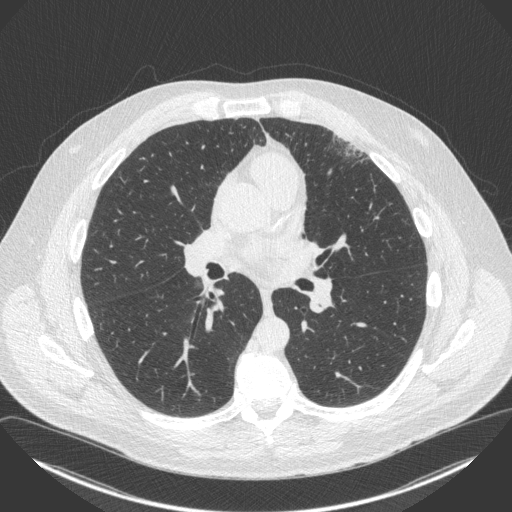
[im 173/331  mediastinal]
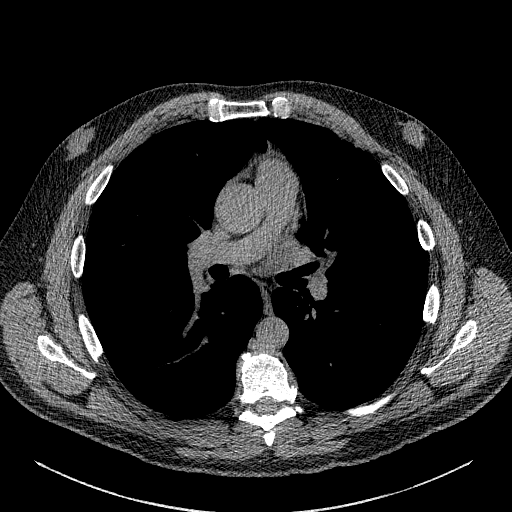
[im 173/331  lung]
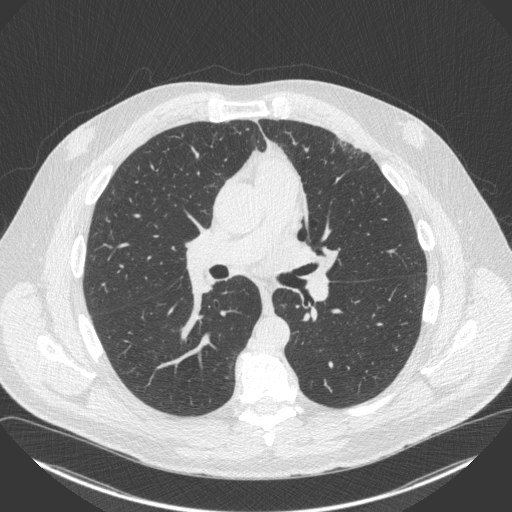
[im 201/331  lung]
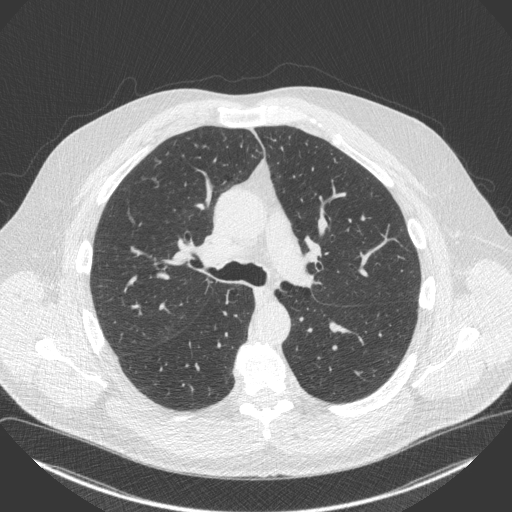
[im 230/331  lung]
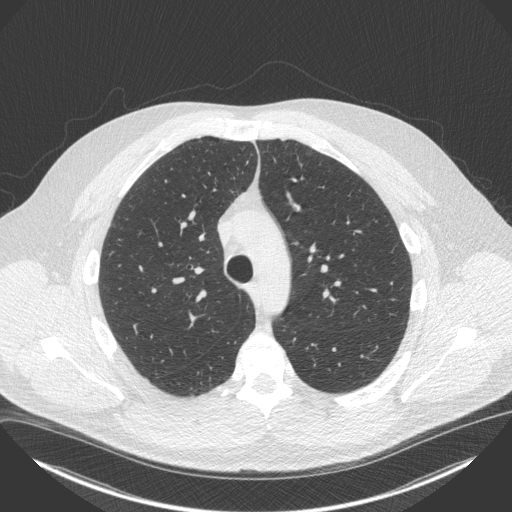
[im 244/331  lung]
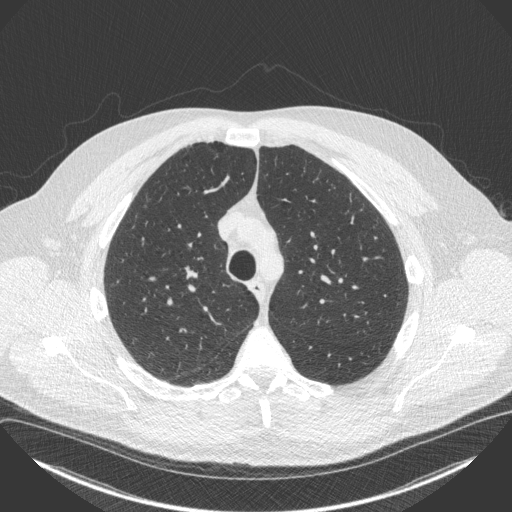
[im 259/331  mediastinal]
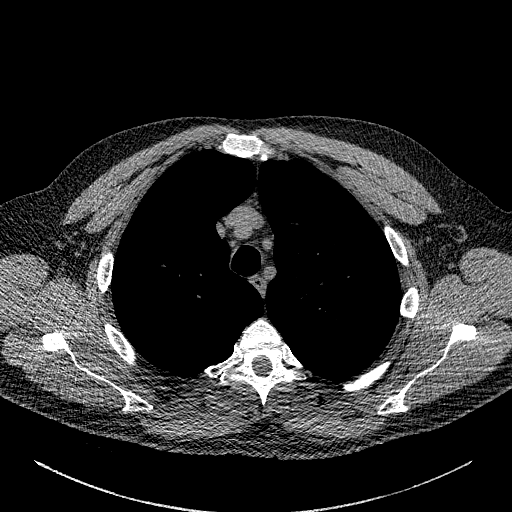
[im 259/331  lung]
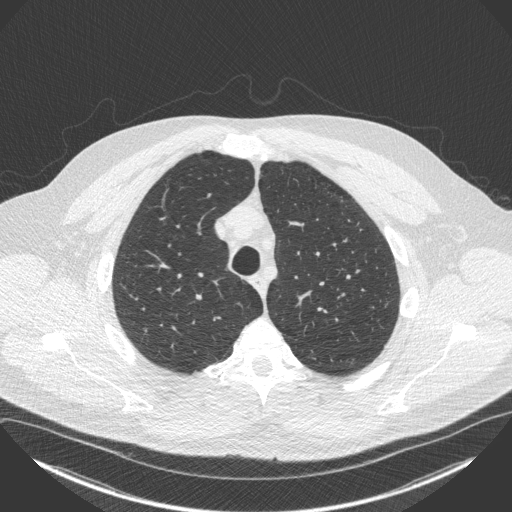
[im 287/331  lung]
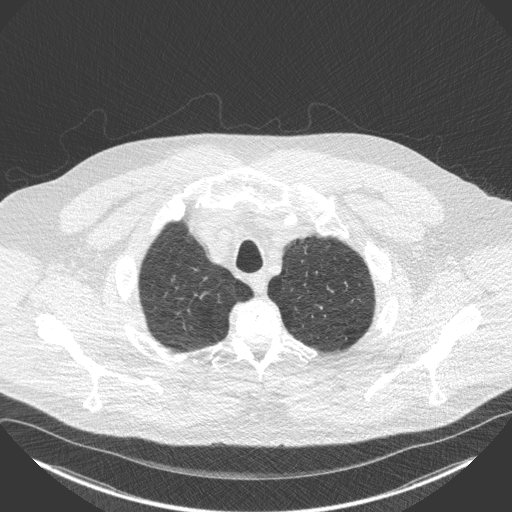
[im 316/331  lung]
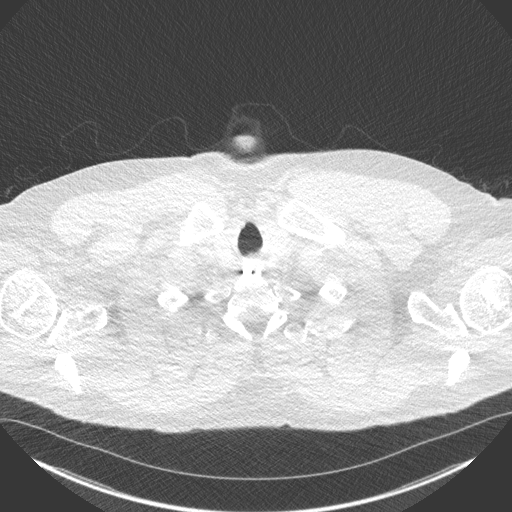

[15 of 33 positions shown; findings below may reference images not displayed]

FINDINGS: Cardiovascular: Coronary artery calcification. Heart size normal. No
pericardial effusion.

Mediastinum/Nodes: No pathologically enlarged mediastinal or
axillary lymph nodes. Hilar regions are difficult to definitively
evaluate without IV contrast. Esophagus is grossly unremarkable.

Lungs/Pleura: Centrilobular and paraseptal emphysema. Mild smoking
related respiratory bronchiolitis. Scattered mucoid impaction.
Subpleural scarring in the lingula. Clustered peribronchovascular
nodularity in the posteromedial right lower lobe (3/204-212) is
likely postinfectious in etiology. 6.9 mm subpleural posteromedial
right lower lobe nodule (3/240). No pleural fluid. Airway is
unremarkable.

Upper Abdomen: Visualized portions of the liver, adrenal glands,
right kidney, spleen and stomach are grossly unremarkable.

Musculoskeletal: Degenerative changes in the spine. No worrisome
lytic or sclerotic lesions.
IMPRESSION: 1. Lung-RADS 3, probably benign findings. Short-term follow-up in 6
months is recommended with repeat low-dose chest CT without contrast
(please use the following order, "CT CHEST LCS NODULE FOLLOW-UP W/O
CM").

6.9 mm subpleural posteromedial right lower lobe nodule.

These results will be called to the ordering clinician or
representative by the Radiologist Assistant, and communication
documented in the PACS or [REDACTED].
2. Coronary artery calcification.
3.  Emphysema ([id number]).

## 2022-04-19 ENCOUNTER — Ambulatory Visit (INDEPENDENT_AMBULATORY_CARE_PROVIDER_SITE_OTHER): Payer: BC Managed Care – PPO | Admitting: Internal Medicine

## 2022-04-19 ENCOUNTER — Encounter (INDEPENDENT_AMBULATORY_CARE_PROVIDER_SITE_OTHER): Payer: Self-pay | Admitting: Internal Medicine

## 2022-04-19 VITALS — BP 137/82 | HR 87 | Ht 71.0 in | Wt 281.0 lb

## 2022-04-19 DIAGNOSIS — Z0289 Encounter for other administrative examinations: Secondary | ICD-10-CM

## 2022-04-19 DIAGNOSIS — J410 Simple chronic bronchitis: Secondary | ICD-10-CM | POA: Diagnosis not present

## 2022-04-19 DIAGNOSIS — R29818 Other symptoms and signs involving the nervous system: Secondary | ICD-10-CM | POA: Diagnosis not present

## 2022-04-19 DIAGNOSIS — Z6839 Body mass index (BMI) 39.0-39.9, adult: Secondary | ICD-10-CM

## 2022-04-19 NOTE — Progress Notes (Signed)
Office: [phone]  /  Fax: [phone]   Initial Visit  [name] [name] was seen in clinic today to evaluate for obesity. He is interested in losing weight to improve overall health and reduce the risk of weight related complications. He presents today to review program treatment options, initial physical assessment, and evaluation.   He feels that he has gained weight since he got married 4 years ago.  He acknowledges that his relationship revolves around eating as a way of bonding.  He was a former smoker and has history of COPD and has been on chronic inhaled corticosteroids.  He was referred by: Specialist  When asked what else they would like to accomplish? He states: Adopt healthier eating patterns, Improve existing medical conditions, and Improve quality of life  When asked how has your weight affected you? He states: Contributed to medical problems, Having fatigue, Having poor endurance, and Problems with eating patterns  Some associated conditions: Lung disease  Contributing factors: Family history, Disruption of circadian rhythm, Nutritional, Eating patterns, and Life event  Weight promoting medications identified: Steroids  Current nutrition plan: None  Current level of physical activity: NEAT  Current or previous pharmacotherapy: None  Response to medication: Never tried medications   Past medical history includes:   Past Medical History:  Diagnosis Date   Asthma      Objective:   BP 137/82   Pulse 87   Ht '5\' 11"'$  (1.803 m)   Wt 281 lb (127.5 kg)   SpO2 97%   BMI 39.19 kg/m  He was weighed on the bioimpedance scale: Body mass index is 39.19 kg/m.  Peak Weight: 281, Body Fat%: 36, Visceral Fat Rating: 22, Weight trend over the last 12 months: Unchanged  General:  Alert, oriented and cooperative. Patient is in no acute distress.  Respiratory: Normal respiratory effort, no problems with respiration noted  Extremities: Normal range of motion.     Mental Status: Normal mood and affect. Normal behavior. Normal judgment and thought content.   Assessment and Plan:  1. Class 2 severe obesity with serious comorbidity and body mass index (BMI) of 39.0 to 39.9 in adult, unspecified obesity type ([place]) We reviewed weight, biometrics, associated medical conditions and contributing factors with patient. She would benefit from weight loss therapy via a modified calorie, low-carb, high-protein nutritional plan tailored to their REE (resting energy expenditure) which will be determined by indirect calorimetry.  We will also assess for cardiometabolic risk and nutritional derangements via fasting serologies at her next appointment.  2. Simple chronic bronchitis (HCC) Unknown severity but limits physical capacity.  He is on triple therapy and has been on inhaled corticosteroids for several years.  This may contribute to weight gain.  He reports good compliance and denies any adverse effects.  I reviewed spirometry.  He may also benefit from pulmonary rehab if that has not been offered.  Weight loss of about 10 to 15% may also help with restrictive ventilatory defects.  3. Suspected sleep apnea He has symptoms suspicious for sleep apnea.  Has occasional nighttime arousals and sense of choking.  He has not had a sleep study.  We will perform screening at his intake appointment and consider referral for sleep study in lab because of his COPD.       Obesity Treatment / Action Plan:  Patient will work on garnering support from family and friends to begin weight loss journey. Will work on eliminating or reducing the presence of highly palatable, calorie dense foods  in the home. Will complete provided nutritional and psychosocial assessment questionnaire before the next appointment. Will be scheduled for indirect calorimetry to determine resting energy expenditure in a fasting state.  This will allow [place] to create a reduced calorie, high-protein meal plan to  promote loss of fat mass while preserving muscle mass. Counseled on the health benefits of losing 5%-15% of total body weight. Was counseled on nutritional approaches to weight loss and benefits of complex carbs and high quality protein as part of nutritional weight management. Was counseled on pharmacotherapy and role as an adjunct in weight management.   Obesity Education Performed Today:  He was weighed on the bioimpedance scale and results were discussed and documented in the synopsis.  We discussed obesity as a disease and the importance of a more detailed evaluation of all the factors contributing to the disease.  We discussed the importance of long term lifestyle changes which include nutrition, exercise and behavioral modifications as well as the importance of customizing this to his specific health and social needs.  We discussed the benefits of reaching a healthier weight to alleviate the symptoms of existing conditions and reduce the risks of the biomechanical, metabolic and psychological effects of obesity.  [name] [name] appears to be in the action stage of change and states they are ready to start intensive lifestyle modifications and behavioral modifications.  30 minutes was spent today on this visit including the above counseling, pre-visit chart review, and post-visit documentation.  Reviewed by clinician on day of visit: allergies, medications, problem list, medical history, surgical history, family history, social history, and previous encounter notes.    I have reviewed the above documentation for accuracy and completeness, and I agree with the above.  Thomes Dinning, MD

## 2022-05-10 ENCOUNTER — Ambulatory Visit (INDEPENDENT_AMBULATORY_CARE_PROVIDER_SITE_OTHER): Payer: BC Managed Care – PPO | Admitting: Internal Medicine

## 2022-05-10 ENCOUNTER — Encounter (INDEPENDENT_AMBULATORY_CARE_PROVIDER_SITE_OTHER): Payer: Self-pay

## 2022-05-17 ENCOUNTER — Ambulatory Visit (HOSPITAL_COMMUNITY): Payer: BC Managed Care – PPO | Attending: Nurse Practitioner

## 2022-05-17 DIAGNOSIS — R0602 Shortness of breath: Secondary | ICD-10-CM | POA: Insufficient documentation

## 2022-05-17 DIAGNOSIS — R600 Localized edema: Secondary | ICD-10-CM | POA: Diagnosis present

## 2022-05-17 LAB — ECHOCARDIOGRAM COMPLETE
Area-P 1/2: 3.72 cm2
S' Lateral: 3 cm

## 2022-05-22 NOTE — Progress Notes (Signed)
Please notify patient that his echo showed normal pumping function. He does have some mild strain to the left side of his heart and impaired relaxation, which is called diastolic dysfunction or diastolic HF. With his leg swelling and these findings, I recommend referral to cardiology. Thanks.

## 2022-05-24 ENCOUNTER — Other Ambulatory Visit: Payer: Self-pay

## 2022-05-24 ENCOUNTER — Ambulatory Visit (INDEPENDENT_AMBULATORY_CARE_PROVIDER_SITE_OTHER): Payer: BC Managed Care – PPO | Admitting: Internal Medicine

## 2022-05-24 DIAGNOSIS — I5189 Other ill-defined heart diseases: Secondary | ICD-10-CM

## 2022-06-01 ENCOUNTER — Other Ambulatory Visit: Payer: Self-pay | Admitting: Pulmonary Disease

## 2022-06-06 ENCOUNTER — Encounter (INDEPENDENT_AMBULATORY_CARE_PROVIDER_SITE_OTHER): Payer: Self-pay | Admitting: Internal Medicine

## 2022-06-06 ENCOUNTER — Ambulatory Visit (INDEPENDENT_AMBULATORY_CARE_PROVIDER_SITE_OTHER): Payer: BC Managed Care – PPO | Admitting: Internal Medicine

## 2022-06-06 VITALS — BP 130/84 | HR 68 | Temp 97.9°F | Ht 71.0 in | Wt 275.0 lb

## 2022-06-06 DIAGNOSIS — R29818 Other symptoms and signs involving the nervous system: Secondary | ICD-10-CM | POA: Diagnosis not present

## 2022-06-06 DIAGNOSIS — Z6839 Body mass index (BMI) 39.0-39.9, adult: Secondary | ICD-10-CM | POA: Diagnosis not present

## 2022-06-06 DIAGNOSIS — Z1331 Encounter for screening for depression: Secondary | ICD-10-CM

## 2022-06-06 DIAGNOSIS — R5383 Other fatigue: Secondary | ICD-10-CM

## 2022-06-06 DIAGNOSIS — R7309 Other abnormal glucose: Secondary | ICD-10-CM | POA: Diagnosis not present

## 2022-06-06 DIAGNOSIS — R0602 Shortness of breath: Secondary | ICD-10-CM | POA: Diagnosis not present

## 2022-06-06 DIAGNOSIS — Z789 Other specified health status: Secondary | ICD-10-CM

## 2022-06-06 NOTE — Assessment & Plan Note (Signed)
Patient is at risk for insulin resistance and prediabetes.  We will check a fasting blood glucose, insulin levels and hemoglobin A1c.

## 2022-06-06 NOTE — Assessment & Plan Note (Signed)
He has been drinking multiple caffeinated beverages a day exceeding 200 mg of caffeine.  We discussed that above this threshold and may activate amygdala and cause anxiety and sleep disruption.  He will work on reducing caffeinated beverages.

## 2022-06-06 NOTE — Progress Notes (Unsigned)
Chief Complaint:   [name] [name] (MR# [id number]) is a 52 y.o. male who presents for evaluation and treatment of obesity and related comorbidities. Current BMI is Body mass index is 38.35 kg/m. [name] [name] has been struggling with his weight for many years and has been unsuccessful in either losing weight, maintaining weight loss, or reaching his healthy weight goal.  [name] [name] is currently in the action stage of change and ready to dedicate time achieving and maintaining a healthier weight. [name] [name] is interested in becoming our patient and working on intensive lifestyle modifications including (but not limited to) diet and exercise for weight loss.  [name] [name]'s habits were reviewed today and are as follows: he struggles with family and or coworkers weight loss sabotage, his desired weight loss is 55 lbs, he has been heavy most of his life, he started gaining weight in 2019, he has significant food cravings issues, he snacks frequently in the evenings, he wakes up frequently in the middle of the night to eat, he is frequently drinking liquids with calories, he frequently makes poor food choices, he frequently eats larger portions than normal, he has binge eating behaviors, and he struggles with emotional eating.  Depression Screen [name] [name]'s Food and Mood (modified PHQ-9) score was 21.   Subjective:   1. Other fatigue [name] [name] admits to daytime somnolence and admits to waking up still tired. Patient has a history of symptoms of daytime fatigue, morning fatigue, and morning headache. [name] [name] generally gets 6 hours of sleep per night, and states that he has poor sleep quality. Snoring is present. Apneic episodes are present. Epworth Sleepiness Score is 11.   2. SOB (shortness of breath) on exertion [name] [name] notes increasing shortness of breath with exercising and seems to be worsening over time with weight gain. He notes getting out of breath sooner with  activity than he used to. This has gotten worse recently. [name] [name] denies shortness of breath at rest or orthopnea.  3. Suspected sleep apnea He has an Epworth of 11, has reports of loud snoring, morning headaches and woken up gasping for air.  He also has COPD.  His neck circumference is 17 3/4 " MP 3.  4. Abnormal glucose Patient is at risk for insulin resistance and prediabetes.   5. Excessive caffeine intake He has been drinking multiple caffeinated beverages a day exceeding 200 mg of caffeine.    Assessment/Plan:   1. Other fatigue [name] [name] does feel that his weight is causing his energy to be lower than it should be. Fatigue may be related to obesity, depression or many other causes. Labs will be ordered, and in the meanwhile, [name] [name] will focus on self care including making healthy food choices, increasing physical activity and focusing on stress reduction.  Lab/Orders: - EKG 12-Lead - Vitamin B12 - CBC with Differential/Platelet  2. SOB (shortness of breath) on exertion [name] [name] does feel that he gets out of breath more easily that he used to when he exercises. [name] [name]'s shortness of breath appears to be obesity related and exercise induced. He has agreed to work on weight loss and gradually increase exercise to treat his exercise induced shortness of breath. Will continue to monitor closely.  3. Suspected sleep apnea He has an Epworth of 11, has reports of loud snoring, morning headaches and woken up gasping for air.  He also has COPD.  His neck circumference is 17 3/4 " MP 3.  I recommend that he have a sleep study.  He will  discuss this with his pulmonologist.  4. Abnormal glucose Patient is at risk for insulin resistance and prediabetes.  We will check a fasting blood glucose, insulin levels and hemoglobin A1c.  Lab/Orders: - Comprehensive metabolic panel - Hemoglobin A1c - Insulin, random  5. Excessive caffeine intake He has been drinking multiple  caffeinated beverages a day exceeding 200 mg of caffeine.  We discussed that above this threshold and may activate amygdala and cause anxiety and sleep disruption.  He will work on reducing caffeinated beverages.  6. Depression screening [name] [name] had a positive depression screening. Depression is commonly associated with obesity and often results in emotional eating behaviors. We will monitor this closely and work on CBT to help improve the non-hunger eating patterns. Referral to Psychology may be required if no improvement is seen as he continues in our clinic.  7. Class 2 severe obesity with serious comorbidity and body mass index (BMI) of 39.0 to 39.9 in adult, unspecified obesity type ([name]) Lab/Orders: - TSH - VITAMIN D 25 Hydroxy (Vit-D Deficiency, Fractures) - Lipid Panel With LDL/HDL Ratio  [name] [name] is currently in the action stage of change and his goal is to continue with weight loss efforts. I recommend [name] [name] begin the structured treatment plan as follows:  He has agreed to the Category 4 Plan.  Exercise goals: All adults should avoid inactivity. Some physical activity is better than none, and adults who participate in any amount of physical activity gain some health benefits.   Behavioral modification strategies: increasing lean protein intake, decreasing simple carbohydrates, increasing vegetables, increasing water intake, decreasing liquid calories, increasing high fiber foods, decreasing eating out, no skipping meals, meal planning and cooking strategies, keeping healthy foods in the home, ways to avoid boredom eating, better snacking choices, avoiding temptations, planning for success, and decreasing junk food.  He was informed of the importance of frequent follow-up visits to maximize his success with intensive lifestyle modifications for his multiple health conditions. He was informed we would discuss his lab results at his next visit unless there is a critical issue  that needs to be addressed sooner. [name] [name] agreed to keep his next visit at the agreed upon time to discuss these results.  Objective:   Blood pressure 130/84, pulse 68, temperature 97.9 F (36.6 C), height '5\' 11"'$  (1.803 m), weight 275 lb (124.7 kg), SpO2 95 %. Body mass index is 38.35 kg/m.  EKG: Normal sinus rhythm, rate 83.  Indirect Calorimeter completed today shows a VO2 of 340 and a REE of 2347.  His calculated basal metabolic rate is [id number] thus his basal metabolic rate is worse than expected.  General: Cooperative, alert, well developed, in no acute distress. HEENT: Conjunctivae and lids unremarkable. Cardiovascular: Regular rhythm.  Lungs: Normal work of breathing. Neurologic: No focal deficits.   Lab Results  Component Value Date   CREATININE 1.16 06/06/2022   BUN 13 06/06/2022   NA 137 06/06/2022   K 4.4 06/06/2022   CL 99 06/06/2022   CO2 21 06/06/2022   Lab Results  Component Value Date   ALT 39 06/06/2022   AST 23 06/06/2022   ALKPHOS 54 06/06/2022   BILITOT 0.5 06/06/2022   Lab Results  Component Value Date   HGBA1C 5.9 (H) 06/06/2022   Lab Results  Component Value Date   INSULIN 25.0 (H) 06/06/2022   Lab Results  Component Value Date   TSH 1.380 06/06/2022   Lab Results  Component Value Date   CHOL 173 06/06/2022   HDL  49 06/06/2022   LDLCALC 103 (H) 06/06/2022   TRIG 118 06/06/2022   Lab Results  Component Value Date   WBC 4.2 06/06/2022   HGB 16.4 06/06/2022   HCT 47.3 06/06/2022   MCV 91 06/06/2022   PLT 207 06/06/2022    Attestation Statements:   Reviewed by clinician on day of visit: allergies, medications, problem list, medical history, surgical history, family history, social history, and previous encounter notes.  Time spent on visit including pre-visit chart review and post-visit charting and care was 40 minutes.   I, Kathlene November, BS, CMA, am acting as transcriptionist for Thomes Dinning, MD.  I have reviewed the  above documentation for accuracy and completeness, and I agree with the above. -Thomes Dinning, MD

## 2022-06-06 NOTE — Assessment & Plan Note (Signed)
He has an Epworth of 11, has reports of loud snoring, morning headaches and woken up gasping for air.  He also has COPD.  His neck circumference is 17 3/4 " MP 3.  I recommend that he have a sleep study.  He will discuss this with his pulmonologist.

## 2022-06-07 LAB — CBC WITH DIFFERENTIAL/PLATELET
Basophils Absolute: 0 10*3/uL (ref 0.0–0.2)
Basos: 1 %
EOS (ABSOLUTE): 0.1 10*3/uL (ref 0.0–0.4)
Eos: 2 %
Hematocrit: 47.3 % (ref 37.5–51.0)
Hemoglobin: 16.4 g/dL (ref 13.0–17.7)
Immature Grans (Abs): 0 10*3/uL (ref 0.0–0.1)
Immature Granulocytes: 0 %
Lymphocytes Absolute: 1.5 10*3/uL (ref 0.7–3.1)
Lymphs: 36 %
MCH: 31.5 pg (ref 26.6–33.0)
MCHC: 34.7 g/dL (ref 31.5–35.7)
MCV: 91 fL (ref 79–97)
Monocytes Absolute: 0.3 10*3/uL (ref 0.1–0.9)
Monocytes: 7 %
Neutrophils Absolute: 2.3 10*3/uL (ref 1.4–7.0)
Neutrophils: 54 %
Platelets: 207 10*3/uL (ref 150–450)
RBC: 5.21 x10E6/uL (ref 4.14–5.80)
RDW: 12.2 % (ref 11.6–15.4)
WBC: 4.2 10*3/uL (ref 3.4–10.8)

## 2022-06-07 LAB — COMPREHENSIVE METABOLIC PANEL
ALT: 39 IU/L (ref 0–44)
AST: 23 IU/L (ref 0–40)
Albumin/Globulin Ratio: 2 (ref 1.2–2.2)
Albumin: 4.5 g/dL (ref 3.8–4.9)
Alkaline Phosphatase: 54 IU/L (ref 44–121)
BUN/Creatinine Ratio: 11 (ref 9–20)
BUN: 13 mg/dL (ref 6–24)
Bilirubin Total: 0.5 mg/dL (ref 0.0–1.2)
CO2: 21 mmol/L (ref 20–29)
Calcium: 9.7 mg/dL (ref 8.7–10.2)
Chloride: 99 mmol/L (ref 96–106)
Creatinine, Ser: 1.16 mg/dL (ref 0.76–1.27)
Globulin, Total: 2.2 g/dL (ref 1.5–4.5)
Glucose: 90 mg/dL (ref 70–99)
Potassium: 4.4 mmol/L (ref 3.5–5.2)
Sodium: 137 mmol/L (ref 134–144)
Total Protein: 6.7 g/dL (ref 6.0–8.5)
eGFR: 75 mL/min/{1.73_m2} (ref >59)

## 2022-06-07 LAB — HEMOGLOBIN A1C
Est. average glucose Bld gHb Est-mCnc: 123 mg/dL
Hgb A1c MFr Bld: 5.9 % — ABNORMAL HIGH (ref 4.8–5.6)

## 2022-06-07 LAB — LIPID PANEL WITH LDL/HDL RATIO
Cholesterol, Total: 173 mg/dL (ref 100–199)
HDL: 49 mg/dL (ref >39)
LDL Chol Calc (NIH): 103 mg/dL — ABNORMAL HIGH (ref 0–99)
LDL/HDL Ratio: 2.1 ratio (ref 0.0–3.6)
Triglycerides: 118 mg/dL (ref 0–149)
VLDL Cholesterol Cal: 21 mg/dL (ref 5–40)

## 2022-06-07 LAB — TSH: TSH: 1.38 u[IU]/mL (ref 0.450–4.500)

## 2022-06-07 LAB — VITAMIN D 25 HYDROXY (VIT D DEFICIENCY, FRACTURES): Vit D, 25-Hydroxy: 12.9 ng/mL — ABNORMAL LOW (ref 30.0–100.0)

## 2022-06-07 LAB — VITAMIN B12: Vitamin B-12: 320 pg/mL (ref 232–1245)

## 2022-06-07 LAB — INSULIN, RANDOM: INSULIN: 25 u[IU]/mL — ABNORMAL HIGH (ref 2.6–24.9)

## 2022-06-07 NOTE — Progress Notes (Signed)
CARDIOLOGY CONSULT NOTE       Patient ID: [name] [name] MRN: [id number] DOB/AGE: 54-Apr-1972 54 y.o.  Admit date: (Not on file) Referring Physician: Marland Kitchen NP Primary Physician: [place] Primary Cardiologist: New Reason for Consultation: Dyspnea/Diastolic Dysfunction   HPI:  52 y.o. referred by NP Marland Kitchen  for dyspnea and diastolic dysfunction History of ADD/Anxiety, ETOH abuse COPD quit smoking 2015 with 29 packyear history , Asthma, GERD and chronic back pain. He sees Dr Erin Fulling and has cough and chronic dyspnea from obesity and COPD with reactive component  CT 12/07/21 showed emphysema with clustered tree bud nodularity in RLL ? From atypical infection s and stable sub pleural nodule 6.9 mm Uses Trelegy, Albuterol Singulair and Flonase  PFTls with mild obstruction and air trapping Chronic edema dependant from obesity at weight of 286 lbs. 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 He does not report any known asbestos exposure. He also previously worked as a Audiological scientist.   Edema is mild and clearly dependant from being overweight and on feet all day Seeing weight loss clinic last 3 weeks Has 3 dogs at home he walks and does lots of yard work Gets 12,000 steps in at work routinely  On his 3 rd marriage but married his first wife twice. Eats out a lot. Originally from [place] other systems reviewed and negative except as noted above  Past Medical History:  Diagnosis Date   ADD (attention deficit disorder)    Alcohol abuse    Anxiety    Asthma    Back pain    Cancer  ([place])    COPD with emphysema (H[facility])    Depression    Drug use    Edema of both lower extremities    Fatty liver    GERD (gastroesophageal reflux disease)    Joint pain    SOB (shortness of breath)    Traumatic pneumonia ([place])     Family History  Problem Relation Age of Onset   Heart disease Mother    Cancer Mother    Depression Mother    Obesity Mother    Heart disease Father    Sudden death Father    Alcoholism Father    Drug abuse Father     Social History   Socioeconomic History   Marital status: Married    Spouse name: Raquel Sarna   Number of children: Not on file   Years of education: Not on file   Highest education level: Not on file  Occupational History   Occupation: Full Time Customer Service Rep - [employer]  Tobacco Use   Smoking status: Former    Packs/day: 1.00    Years: 29.00    Total pack years: 29.00    Types: Cigarettes    Quit date: 2015    Years since quitting: 9.2   Smokeless tobacco: Not on file  Substance and Sexual Activity   Alcohol use: Not on file   Drug use: Not on file   Sexual activity: Not on file  Other Topics Concern   Not on file  Social History  Narrative   Not on file   Social Determinants of Health   Financial Resource Strain: Not on file  Food Insecurity: Not on file  Transportation Needs: Not on file  Physical Activity: Not on file  Stress: Not on file  Social Connections: Not on file  Intimate Partner Violence: Not on file    Past Surgical History:  Procedure Laterality Date   KNEE ARTHROSCOPY W/ ACL RECONSTRUCTION Bilateral       Current Outpatient Medications:    albuterol (VENTOLIN HFA) 108 (90 Base) MCG/ACT inhaler, Inhale 2 puffs into the lungs every 6 (six) hours as needed for wheezing or shortness of breath., Disp: 8 g, Rfl: 6   clobetasol ointment (TEMOVATE) 0.05 %, Apply topically 2 (two) times daily., Disp: , Rfl:    fluticasone (FLONASE) 50 MCG/ACT nasal spray, Place into the nose. (Patient not  taking: Reported on 06/06/2022), Disp: , Rfl:    LORazepam (ATIVAN) 0.5 MG tablet, Take 0.5 mg by mouth daily as needed. (Patient not taking: Reported on 06/06/2022), Disp: , Rfl:    montelukast (SINGULAIR) 10 MG tablet, Take 1 tablet (10 mg total) by mouth at bedtime., Disp: 30 tablet, Rfl: 11   omeprazole (PRILOSEC OTC) 20 MG tablet, Take 1 tablet by mouth 2 (two) times daily., Disp: , Rfl:    TRELEGY ELLIPTA 100-62.5-25 MCG/ACT AEPB, INHALE ONE PUFF INTO THE LUNGS DAILY, Disp: 60 each, Rfl: 3   Vitamin D, Ergocalciferol, (DRISDOL) 1.25 MG (50000 UNIT) CAPS capsule, Take 1 capsule (50,000 Units total) by mouth every 7 (seven) days., Disp: 16 capsule, Rfl: 0    Physical Exam: There were no vitals taken for this visit.    Affect appropriate Obese male  HEENT: normal Neck supple with no adenopathy JVP normal no bruits no thyromegaly Lungs clear with no wheezing and good diaphragmatic motion Heart:  S1/S2 no murmur, no rub, gallop or click PMI normal Abdomen: benighn, BS positve, no tenderness, no AAA no bruit.  No HSM or HJR Distal pulses intact with no bruits Plus one bilateral edema Neuro non-focal Skin warm and dry No muscular weakness   Labs:   Lab Results  Component Value Date   WBC 4.2 06/06/2022   HGB 16.4 06/06/2022   HCT 47.3 06/06/2022   MCV 91 06/06/2022   PLT 207 06/06/2022    No results for input(s): "NA", "K", "CL", "CO2", "BUN", "CREATININE", "CALCIUM", "PROT", "BILITOT", "ALKPHOS", "ALT", "AST", "GLUCOSE" in the last 168 hours.  Invalid input(s): "LABALBU"  No results found for: "CKTOTAL", "CKMB", "CKMBINDEX", "TROPONINI"  Lab Results  Component Value Date   CHOL 173 06/06/2022   Lab Results  Component Value Date   HDL 49 06/06/2022   Lab Results  Component Value Date   LDLCALC 103 (H) 06/06/2022   Lab Results  Component Value Date   TRIG 118 06/06/2022     Radiology: No results found.  EKG: SR rate 83 normal 06/06/22    ASSESSMENT AND  PLAN:   Diastolic Dysfunction:  this is only relaxation abnormality and would not explain LE edema. Estimated LVEDP not elevated and no signs of pulmonary HTN with normal EF. Dyspnea and edema from COPD and obesity. BNP has been normal in past Screen CAD:  recommended calcium score  Pulmonary:  prior smoking with COPD, ? Prior atypical infection RLL and sub pleural nodule F/U Dr Erin Fulling  Obesity:  referred to weight loss clinic by primary Peripheral edema:  from obesity mild no need for diuretic   Calcium  Score  F/U Cardiology PRN   Signed: Jenkins Rouge 06/21/2022, 10:05 AM

## 2022-06-09 ENCOUNTER — Ambulatory Visit: Payer: BC Managed Care – PPO | Admitting: Nurse Practitioner

## 2022-06-20 ENCOUNTER — Ambulatory Visit (INDEPENDENT_AMBULATORY_CARE_PROVIDER_SITE_OTHER): Payer: BC Managed Care – PPO | Admitting: Internal Medicine

## 2022-06-20 ENCOUNTER — Encounter (INDEPENDENT_AMBULATORY_CARE_PROVIDER_SITE_OTHER): Payer: Self-pay | Admitting: Internal Medicine

## 2022-06-20 VITALS — BP 135/84 | HR 75 | Temp 97.5°F | Ht 71.0 in | Wt 274.0 lb

## 2022-06-20 DIAGNOSIS — Z789 Other specified health status: Secondary | ICD-10-CM | POA: Diagnosis not present

## 2022-06-20 DIAGNOSIS — E559 Vitamin D deficiency, unspecified: Secondary | ICD-10-CM | POA: Diagnosis not present

## 2022-06-20 DIAGNOSIS — R7303 Prediabetes: Secondary | ICD-10-CM

## 2022-06-20 DIAGNOSIS — R030 Elevated blood-pressure reading, without diagnosis of hypertension: Secondary | ICD-10-CM

## 2022-06-20 MED ORDER — VITAMIN D (ERGOCALCIFEROL) 1.25 MG (50000 UNIT) PO CAPS: 50000.0000 [IU] | ORAL_CAPSULE | ORAL | 0 refills | Status: DC

## 2022-06-20 NOTE — Assessment & Plan Note (Signed)
Most recent A1c is  Lab Results  Component Value Date   HGBA1C 5.9 (H) 06/06/2022   Patient informed of disease state and risk of progression. This may contribute to abnormal cravings, fatigue and diabetes complications without having diabetes.   We reviewed treatment options which include losing 7 to 10% of body weight, increasing physical activity to a 150 minutes a week of moderate intensity.He is also a candidate for pharmacoprophylaxis with metformin or incretin mimetic.  At this present time he declines treatment.

## 2022-06-20 NOTE — Progress Notes (Signed)
Office: [phone]  /  Fax: [phone]  WEIGHT SUMMARY AND BIOMETRICS  Vitals Temp: (!) 97.5 F (36.4 C) BP: 135/84 Pulse Rate: 75 SpO2: 95 %   Anthropometric Measurements Height: '5\' 11"'$  (1.803 m) Weight: 274 lb (124.3 kg) BMI (Calculated): 38.23 Weight at Last Visit: 275 lb Weight Lost Since Last Visit: 1 lb Starting Weight: 275 lb Total Weight Loss (lbs): 1 lb (0.454 kg) Peak Weight: 281 lb   Body Composition  Body Fat %: 35.4 % Fat Mass (lbs): 97.2 lbs Muscle Mass (lbs): 168.8 lbs Total Body Water (lbs): 125.2 lbs Visceral Fat Rating : 21    HPI  Chief Complaint: OBESITY  [name] [name] is here to discuss his progress with his obesity treatment plan. He has not followed the plan but made better choices.  He states he is not exercising.  Interval History:  Since last office visit he has lost 1 pound. He reports fair adherence to reduced calorie nutritional plan.  And progressing He has been working on meal prepping and planning.  He has increased water consumption and decreased soda.  He is also decrease caffeinated beverages. '[x]'$ Denies '[]'$ Reports problems with appetite and hunger signals.  '[x]'$ Denies '[]'$ Reports problems with satiety and satiation.  '[x]'$ Denies '[]'$ Reports problems with eating patterns and portion control.  '[x]'$ Denies '[]'$ Reports abnormal cravings Sleeping approximately 6 hours a day.  Sleep described as: '[]'$ Restorative '[]'$ Unrestorative '[]'$ Interrupted Stress levels are reported as medium and due to Work Family.  Barriers identified lack of time for self-care and exposure to enticing environments and or relationships.  Fruit and greek yogurt for breakfast, has been traveling, joined the gym. Seeing cardiologist. Pharmacotherapy for weight loss: He is currently taking no anti-obesity medication.   Weight promoting medications identified: None.  ASSESSMENT AND PLAN  TREATMENT PLAN FOR OBESITY:  Recommended Dietary Goals  [name] [name] is currently in  the action stage of change. As such, his goal is to continue weight management plan. He has agreed to: the Category 4 Plan.  Behavioral Intervention  We discussed the following Behavioral Modification Strategies today: increasing lean protein intake, decreasing simple carbohydrates , increasing vegetables, increasing lower glycemic fruits, increasing fiber rich foods, avoiding skipping meals, increasing water intake, work on meal planning and easy cooking plans, decreasing eating out, consumption of processed foods, and making healthy choices when eating convenient foods, and reading food labels .  Additional resources provided today: None  Recommended Physical Activity Goals  [name] [name] has been advised to work up to 150 minutes of moderate intensity aerobic activity a week and strengthening exercises 2-3 times per week for cardiovascular health, weight loss maintenance and preservation of muscle mass.  He is thinking of starting to go to the gym.  He has agreed to :  '[]'$  Continue current level of physical activity  '[x]'$  Think about ways to increase physical activity '[]'$  Start strengthening exercises with a goal of 2-3 sessions a week  '[]'$  Start aerobic activity with a goal of 150 minutes a week at moderate intensity.  '[]'$  Increase the intensity, frequency or duration of strengthening exercises  '[]'$  Increase the intensity, frequency or duration of aerobic exercises   '[]'$  Increase physical activity in their day and reduce sedentary time (increase NEAT). '[]'$  Work on scheduling and tracking physical activity.   Pharmacotherapy We discussed various medication options to help [name] [name] with his weight loss efforts and we both agreed to : continue with nutritional and behavioral strategies  ASSOCIATED CONDITIONS ADDRESSED TODAY  Vitamin D deficiency Assessment & Plan: Most recent  vitamin D levels  Lab Results  Component Value Date   VD25OH 12.9 (L) 06/06/2022     Deficiency state associated  with adiposity and may result in leptin resistance, weight gain and fatigue.   Plan: After discussion of benefits, alternative treatment options and side effects patient will be started on vitamin D2 50,000 units 1 tablet weekly for 3-4 months. for a treatment goal level of 50-60 mg/dl. Check levels at that time for response monitoring.   Orders: -     Vitamin D (Ergocalciferol); Take 1 capsule (50,000 Units total) by mouth every 7 (seven) days.  Dispense: 16 capsule; Refill: 0  Elevated blood pressure reading without diagnosis of hypertension Assessment & Plan: His blood pressure is elevated.  Blood pressure goal is less than 120/80.  I would like for him to begin monitoring blood pressure at home to look at trends.  Losing 10% of body weight may improve blood pressure control.   Prediabetes Assessment & Plan: Most recent A1c is  Lab Results  Component Value Date   HGBA1C 5.9 (H) 06/06/2022   Patient informed of disease state and risk of progression. This may contribute to abnormal cravings, fatigue and diabetes complications without having diabetes.   We reviewed treatment options which include losing 7 to 10% of body weight, increasing physical activity to a 150 minutes a week of moderate intensity.He is also a candidate for pharmacoprophylaxis with metformin or incretin mimetic.  At this present time he declines treatment.    Excessive caffeine intake Assessment & Plan: Improved.  Has reduced consumption of caffeinated beverages to just 2 a day.  He did have some headaches for 2 days but these have resolved.  Continue to monitor      PHYSICAL EXAM:  Blood pressure 135/84, pulse 75, temperature (!) 97.5 F (36.4 C), height '5\' 11"'$  (1.803 m), weight 274 lb (124.3 kg), SpO2 95 %. Body mass index is 38.22 kg/m.  General: He is overweight, cooperative, alert, well developed, and in no acute distress. PSYCH: Has normal mood, affect and thought process.   HEENT: EOMI, sclerae  are anicteric. Lungs: Normal breathing effort, no conversational dyspnea. Extremities: No edema.  Neurologic: No gross sensory or motor deficits. No tremors or fasciculations noted.    DIAGNOSTIC DATA REVIEWED:  BMET    Component Value Date/Time   NA 137 06/06/2022 0957   K 4.4 06/06/2022 0957   CL 99 06/06/2022 0957   CO2 21 06/06/2022 0957   GLUCOSE 90 06/06/2022 0957   GLUCOSE 110 (H) 02/08/2022 1050   BUN 13 06/06/2022 0957   CREATININE 1.16 06/06/2022 0957   CALCIUM 9.7 06/06/2022 0957   GFRNONAA >60 02/20/2021 1336   Lab Results  Component Value Date   HGBA1C 5.9 (H) 06/06/2022   Lab Results  Component Value Date   INSULIN 25.0 (H) 06/06/2022   Lab Results  Component Value Date   TSH 1.380 06/06/2022   CBC    Component Value Date/Time   WBC 4.2 06/06/2022 0957   WBC 6.1 02/20/2021 1336   RBC 5.21 06/06/2022 0957   RBC 5.12 02/20/2021 1336   HGB 16.4 06/06/2022 0957   HCT 47.3 06/06/2022 0957   PLT 207 06/06/2022 0957   MCV 91 06/06/2022 0957   MCH 31.5 06/06/2022 0957   MCH 30.7 02/20/2021 1336   MCHC 34.7 06/06/2022 0957   MCHC 34.1 02/20/2021 1336   RDW 12.2 06/06/2022 0957   Iron Studies No results found for: "IRON", "TIBC", "FERRITIN", "IRONPCTSAT"  Lipid Panel     Component Value Date/Time   CHOL 173 06/06/2022 0957   TRIG 118 06/06/2022 0957   HDL 49 06/06/2022 0957   LDLCALC 103 (H) 06/06/2022 0957   Hepatic Function Panel     Component Value Date/Time   PROT 6.7 06/06/2022 0957   ALBUMIN 4.5 06/06/2022 0957   AST 23 06/06/2022 0957   ALT 39 06/06/2022 0957   ALKPHOS 54 06/06/2022 0957   BILITOT 0.5 06/06/2022 0957      Component Value Date/Time   TSH 1.380 06/06/2022 0957   Nutritional Lab Results  Component Value Date   VD25OH 12.9 (L) 06/06/2022     Return in about 2 weeks (around 07/04/2022) for For Weight Mangement with Dr. Gerarda Fraction.Marland Kitchen He was informed of the importance of frequent follow up visits to maximize his success  with intensive lifestyle modifications for his multiple health conditions.   ATTESTASTION STATEMENTS:  Reviewed by clinician on day of visit: allergies, medications, problem list, medical history, surgical history, family history, social history, and previous encounter notes.   Time spent on visit including pre-visit chart review and post-visit care and charting was 40 minutes.    Thomes Dinning, MD

## 2022-06-20 NOTE — Assessment & Plan Note (Signed)
Most recent vitamin D levels  Lab Results  Component Value Date   VD25OH 12.9 (L) 06/06/2022     Deficiency state associated with adiposity and may result in leptin resistance, weight gain and fatigue.   Plan: After discussion of benefits, alternative treatment options and side effects patient will be started on vitamin D2 50,000 units 1 tablet weekly for 3-4 months. for a treatment goal level of 50-60 mg/dl. Check levels at that time for response monitoring.

## 2022-06-20 NOTE — Assessment & Plan Note (Signed)
Improved.  Has reduced consumption of caffeinated beverages to just 2 a day.  He did have some headaches for 2 days but these have resolved.  Continue to monitor

## 2022-06-20 NOTE — Assessment & Plan Note (Signed)
His blood pressure is elevated.  Blood pressure goal is less than 120/80.  I would like for him to begin monitoring blood pressure at home to look at trends.  Losing 10% of body weight may improve blood pressure control.

## 2022-06-21 ENCOUNTER — Encounter: Payer: Self-pay | Admitting: Cardiovascular Disease

## 2022-06-21 ENCOUNTER — Ambulatory Visit: Payer: BC Managed Care – PPO | Attending: Cardiovascular Disease | Admitting: Cardiovascular Disease

## 2022-06-21 VITALS — BP 120/76 | HR 92 | Ht 71.0 in | Wt 282.0 lb

## 2022-06-21 DIAGNOSIS — Z8249 Family history of ischemic heart disease and other diseases of the circulatory system: Secondary | ICD-10-CM | POA: Diagnosis not present

## 2022-06-21 DIAGNOSIS — R0602 Shortness of breath: Secondary | ICD-10-CM

## 2022-06-21 DIAGNOSIS — R600 Localized edema: Secondary | ICD-10-CM

## 2022-06-21 DIAGNOSIS — I5189 Other ill-defined heart diseases: Secondary | ICD-10-CM | POA: Diagnosis not present

## 2022-06-21 NOTE — Patient Instructions (Signed)
Medication Instructions:  Your physician recommends that you continue on your current medications as directed. Please refer to the Current Medication list given to you today.  *If you need a refill on your cardiac medications before your next appointment, please call your pharmacy*  Lab Work: If you have labs (blood work) drawn today and your tests are completely normal, you will receive your results only by: [place] (if you have MyChart) OR A paper copy in the mail If you have any lab test that is abnormal or we need to change your treatment, we will call you to review the results.  Testing/Procedures: Cardiac CT scanning for calcium score (CAT scanning), is a noninvasive, special x-ray that produces cross-sectional images of the body using x-rays and a computer. CT scans help physicians diagnose and treat medical conditions. For some CT exams, a contrast material is used to enhance visibility in the area of the body being studied. CT scans provide greater clarity and reveal more details than regular x-ray exams.  Follow-Up: At [facility], you and your health needs are our priority.  As part of our continuing mission to provide you with exceptional heart care, we have created designated Provider Care Teams.  These Care Teams include your primary Cardiologist (physician) and Advanced Practice Providers (APPs -  Physician Assistants and Nurse Practitioners) who all work together to provide you with the care you need, when you need it.  We recommend signing up for the patient portal called "MyChart".  Sign up information is provided on this After Visit Summary.  MyChart is used to connect with patients for Virtual Visits (Telemedicine).  Patients are able to view lab/test results, encounter notes, upcoming appointments, etc.  Non-urgent messages can be sent to your provider as well.   To learn more about what you can do with MyChart, go to [email or web address].    Your next  appointment:   As needed  Provider:   Jenkins Rouge, MD

## 2022-06-26 ENCOUNTER — Ambulatory Visit: Payer: BC Managed Care – PPO | Admitting: Nurse Practitioner

## 2022-06-26 ENCOUNTER — Encounter: Payer: Self-pay | Admitting: Nurse Practitioner

## 2022-06-26 VITALS — BP 112/80 | HR 78 | Temp 97.6°F | Ht 71.0 in | Wt 284.0 lb

## 2022-06-26 DIAGNOSIS — J3089 Other allergic rhinitis: Secondary | ICD-10-CM

## 2022-06-26 DIAGNOSIS — Z87891 Personal history of nicotine dependence: Secondary | ICD-10-CM | POA: Diagnosis not present

## 2022-06-26 DIAGNOSIS — J432 Centrilobular emphysema: Secondary | ICD-10-CM | POA: Diagnosis not present

## 2022-06-26 DIAGNOSIS — J309 Allergic rhinitis, unspecified: Secondary | ICD-10-CM | POA: Insufficient documentation

## 2022-06-26 DIAGNOSIS — E669 Obesity, unspecified: Secondary | ICD-10-CM

## 2022-06-26 NOTE — Assessment & Plan Note (Addendum)
Compensated on current regimen. Unable to use flonase on a consistent basis due to epistaxis. This has resolved. Advised he could try nasal antihistamine if symptoms flare/worsen and see if he tolerates this better.

## 2022-06-26 NOTE — Assessment & Plan Note (Signed)
BMI 39. Encouraged to continue working on healthy weight loss measures

## 2022-06-26 NOTE — Patient Instructions (Addendum)
-  Continue Trelegy 1 puff daily. Brush tongue and rinse mouth afterwards -Continue Albuterol inhaler 2 puffs every 6 hours as needed for shortness of breath or wheezing. Notify if symptoms persist despite rescue inhaler/neb use.  -Continue singulair 1 tab At bedtime  -Continue Mucinex [phone] mg daily as needed for chest congestion  You can try astelin nasal spray over the counter for nasal congestion/allergies   Follow up in 4 months with Dr. Erin Fulling (1st) or Katie Alaia Lordi,NP. If symptoms worsen, please contact office for sooner follow up or seek emergency care.

## 2022-06-26 NOTE — Assessment & Plan Note (Signed)
Moderate COPD. Compensated on current regimen. No recent exacerbations. No hospitalizations. Encouraged to remain active. Action plan in place.  Patient Instructions  -Continue Trelegy 1 puff daily. Brush tongue and rinse mouth afterwards -Continue Albuterol inhaler 2 puffs every 6 hours as needed for shortness of breath or wheezing. Notify if symptoms persist despite rescue inhaler/neb use.  -Continue singulair 1 tab At bedtime  -Continue Mucinex [phone] mg daily as needed for chest congestion  You can try astelin nasal spray over the counter for nasal congestion/allergies   Follow up in 4 months with Dr. Erin Fulling (1st) or Katie Ezzard Ditmer,NP. If symptoms worsen, please contact office for sooner follow up or seek emergency care.

## 2022-06-26 NOTE — Progress Notes (Signed)
@Patient  ID: [name] [name], male    DOB: 1970-12-05, 52 y.o.   MRN: [id number]  Chief Complaint  Patient presents with   Follow-up    Patient stated he does feel better. Breathing is much better.     Referring provider: [facility]  HPI: 52 year old male, former smoker followed for emphysema and mild reactive airway disease. He is a patient of Dr. August Albino and last seen in office 12/14/2021. Past medical history significant for obesity.  TEST/EVENTS:  09/08/2021 PFT: FVC 86, FEV1 76, ratio 69, TLC 113, DLCOcor 123.  No BD 12/07/2021 LDCT lung cancer screening: Centrilobular and paraseptal emphysema.  Stable scarring in the left upper lobe.  Lung RADS 2. 05/17/2022 echo: EF 60 to 65%.  G1 DD.  RV size and function is normal.  Normal PASP.  LA moderately dilated.  No significant valvular disease.  12/14/2021: OV with Dr. Erin Fulling. Productive AM cough. Occasional days where he has difficulty clearing mucous/chest congestion. Started on Trelegy in June; PFTs showed mild obstruction with air trapping. Added singulair. Encouraged him to use flonase for nasal congestion.   02/08/2022: OV with Mohini Heathcock NP for acute visit.  When he initially made this appointment, he was struggling with fatigue and a lot of chest congestion and increased cough.  Since then, he has started to feel better.  Feels like he is mostly back to his baseline.  Tells me that he actually feels pretty good today.  No significant congestion or cough.  Breathing is at his baseline.  He does feel he gets good benefit from the Trelegy.  His main concern today is that he has been struggling with lower extremity swelling for the past 2 months now. He denies any orthopnea, PND, palpitations, dizziness, lightheadedness.  Worse in the evenings, especially after working all day.  He is on his feet and walks around 17,000 steps a day.  He does also think that it could be related to his weight.  He is the heaviest that he has ever been.   He would like to work on weight loss measures.  It is hard for him with his work schedule and so he commonly eats fast food.  He did just recently buy some equipment to start increasing his activity level at home.  06/26/2022: Today - follow up Patient presents today for follow-up.  He has been doing well since he was here last.  No flares requiring antibiotics or steroids.  He uses his Trelegy daily and albuterol 1-2 times a week.  Feels like this works well for him.  No significant chest congestion or cough.  Overall, feels like he is doing pretty well.  Has not had any trouble with allergies with the recent weather changes.  He is taking his Singulair at bedtime.  He stopped taking the Flonase because it would randomly give him nosebleeds at times.  Does not feel like he needs to make any add-ons or changes right now.  He is working with medical weight management on weight loss measures.  He did see cardiology since he was here last without any significant findings.  Felt as though his lower extremity edema was just related to obesity.  He is going to be having cardiac CT later this week.  Next lung cancer screening CT is due August 2024.  No Known Allergies  Immunization History  Administered Date(s) Administered   Influenza Inj Mdck Quad Pf 02/25/2021   Influenza-Unspecified 12/24/2021    Past Medical History:  Diagnosis Date   ADD (attention deficit disorder)    Alcohol abuse    Anxiety    Asthma    Back pain    Cancer ([place])    COPD with emphysema (HCC)    Depression    Drug use    Edema of both lower extremities    Fatty liver    GERD (gastroesophageal reflux disease)    Joint pain    SOB (shortness of breath)    Traumatic pneumonia (HCC)     Tobacco History: Social History   Tobacco Use  Smoking Status Former   Packs/day: 1.00   Years: 29.00   Additional pack years: 0.00   Total pack years: 29.00   Types: Cigarettes   Quit date: 2015   Years since quitting: 9.2   Smokeless Tobacco Not on file   Counseling given: Not Answered   Outpatient Medications Prior to Visit  Medication Sig Dispense Refill   albuterol (VENTOLIN HFA) 108 (90 Base) MCG/ACT inhaler Inhale 2 puffs into the lungs every 6 (six) hours as needed for wheezing or shortness of breath. 8 g 6   clobetasol ointment (TEMOVATE) 0.05 % Apply topically 2 (two) times daily.     LORazepam (ATIVAN) 0.5 MG tablet Take 0.5 mg by mouth daily as needed.     montelukast (SINGULAIR) 10 MG tablet Take 1 tablet (10 mg total) by mouth at bedtime. 30 tablet 11   omeprazole (PRILOSEC OTC) 20 MG tablet Take 1 tablet by mouth 2 (two) times daily.     TRELEGY ELLIPTA 100-62.5-25 MCG/ACT AEPB INHALE ONE PUFF INTO THE LUNGS DAILY 60 each 3   Vitamin D, Ergocalciferol, (DRISDOL) 1.25 MG (50000 UNIT) CAPS capsule Take 1 capsule (50,000 Units total) by mouth every 7 (seven) days. 16 capsule 0   fluticasone (FLONASE) 50 MCG/ACT nasal spray Place into the nose. (Patient not taking: Reported on 06/21/2022)     No facility-administered medications prior to visit.     Review of Systems:   Constitutional: No weight loss or gain, night sweats, fevers, chills, or lassitude, fatigue HEENT: No headaches, difficulty swallowing, tooth/dental problems, or sore throat. No sneezing, itching, ear ache, nasal congestion, or post nasal drip CV:  +swelling in lower extremities (baseline, stable). No chest pain, orthopnea, PND, anasarca, dizziness, palpitations, syncope Resp: +shortness of breath with exertion (baseline); AM chronic cough. No excess mucus or change in color of mucus. No hemoptysis. No wheezing.  No chest wall deformity GI:  No heartburn, indigestion GU: No dysuria, change in color of urine, urgency or frequency.   MSK:  No joint pain or swelling.   Neuro: No dizziness or lightheadedness.  Psych: No depression or anxiety. Mood stable.     Physical Exam:  BP 112/80 (BP Location: Left Arm, Patient Position:  Sitting, Cuff Size: Normal)   Pulse 78   Temp 97.6 F (36.4 C) (Oral)   Ht 5\' 11"  (1.803 m)   Wt 284 lb (128.8 kg)   SpO2 98%   BMI 39.61 kg/m   GEN: Pleasant, interactive, well-appearing; obese; in no acute distress HEENT:  Normocephalic and atraumatic. PERRLA. Sclera white. Nasal turbinates pink, moist and patent bilaterally. No rhinorrhea present. Oropharynx pink and moist, without exudate or edema. No lesions, ulcerations, or postnasal drip.  NECK:  Supple w/ fair ROM. No JVD present. Normal carotid impulses w/o bruits. Thyroid symmetrical with no goiter or nodules palpated. No lymphadenopathy.   CV: RRR, no m/r/g, no peripheral edema. Pulses intact, +2 bilaterally. No  cyanosis, pallor or clubbing. PULMONARY:  Unlabored, regular breathing. Clear bilaterally A&P w/o wheezes/rales/rhonchi. No accessory muscle use.  GI: BS present and normoactive. Soft, non-tender to palpation. No organomegaly or masses detected.  MSK: No erythema, warmth or tenderness. No deformities or joint swelling noted.  Neuro: A/Ox3. No focal deficits noted.   Skin: Warm, no lesions or rashe Psych: Normal affect and behavior. Judgement and thought content appropriate.     Lab Results:  CBC    Component Value Date/Time   WBC 4.2 06/06/2022 0957   WBC 6.1 02/20/2021 1336   RBC 5.21 06/06/2022 0957   RBC 5.12 02/20/2021 1336   HGB 16.4 06/06/2022 0957   HCT 47.3 06/06/2022 0957   PLT 207 06/06/2022 0957   MCV 91 06/06/2022 0957   MCH 31.5 06/06/2022 0957   MCH 30.7 02/20/2021 1336   MCHC 34.7 06/06/2022 0957   MCHC 34.1 02/20/2021 1336   RDW 12.2 06/06/2022 0957   LYMPHSABS 1.5 06/06/2022 0957   EOSABS 0.1 06/06/2022 0957   BASOSABS 0.0 06/06/2022 0957    BMET    Component Value Date/Time   NA 137 06/06/2022 0957   K 4.4 06/06/2022 0957   CL 99 06/06/2022 0957   CO2 21 06/06/2022 0957   GLUCOSE 90 06/06/2022 0957   GLUCOSE 110 (H) 02/08/2022 1050   BUN 13 06/06/2022 0957   CREATININE 1.16  06/06/2022 0957   CALCIUM 9.7 06/06/2022 0957   GFRNONAA >60 02/20/2021 1336    BNP No results found for: "BNP"   Imaging:  No results found.       Latest Ref Rng & Units 09/08/2021    9:56 AM  PFT Results  FVC-Pre L 4.57   FVC-Predicted Pre % 86   FVC-Post L 4.73   FVC-Predicted Post % 89   Pre FEV1/FVC % % 68   Post FEV1/FCV % % 69   FEV1-Pre L 3.10   FEV1-Predicted Pre % 76   FEV1-Post L 3.27   DLCO uncorrected ml/min/mmHg 37.40   DLCO UNC% % 123   DLCO corrected ml/min/mmHg 37.40   DLCO COR %Predicted % 123   DLVA Predicted % 118   TLC L 8.24   TLC % Predicted % 113   RV % Predicted % 167     No results found for: "NITRICOXIDE"      Assessment & Plan:   Chronic obstructive pulmonary disease (HCC) Moderate COPD. Compensated on current regimen. No recent exacerbations. No hospitalizations. Encouraged to remain active. Action plan in place.  Patient Instructions  -Continue Trelegy 1 puff daily. Brush tongue and rinse mouth afterwards -Continue Albuterol inhaler 2 puffs every 6 hours as needed for shortness of breath or wheezing. Notify if symptoms persist despite rescue inhaler/neb use.  -Continue singulair 1 tab At bedtime  -Continue Mucinex [phone] mg daily as needed for chest congestion  You can try astelin nasal spray over the counter for nasal congestion/allergies   Follow up in 4 months with Dr. Erin Fulling (1st) or Katie Antonietta Lansdowne,NP. If symptoms worsen, please contact office for sooner follow up or seek emergency care.    Obesity (BMI 35.0-39.9 without comorbidity) BMI 39. Encouraged to continue working on healthy weight loss measures  Allergic rhinitis Compensated on current regimen. Unable to use flonase on a consistent basis due to epistaxis. This has resolved. Advised he could try nasal antihistamine if symptoms flare/worsen and see if he tolerates this better.   Former smoker Next LDCT 11/2022. Followed by lung cancer screening program.  I  spent 28 minutes of dedicated to the care of this patient on the date of this encounter to include pre-visit review of records, face-to-face time with the patient discussing conditions above, post visit ordering of testing, clinical documentation with the electronic health record, making appropriate referrals as documented, and communicating necessary findings to members of the patients care team.  Clayton Bibles, NP 06/26/2022  Pt aware and understands NP's role.

## 2022-06-26 NOTE — Assessment & Plan Note (Signed)
Next LDCT 11/2022. Followed by lung cancer screening program.

## 2022-06-29 ENCOUNTER — Ambulatory Visit
Admission: RE | Admit: 2022-06-29 | Discharge: 2022-06-29 | Disposition: A | Discharge status: Home / Self Care | Payer: BC Managed Care – PPO | Source: Ambulatory Visit | Attending: Cardiovascular Disease | Admitting: Cardiovascular Disease

## 2022-06-29 DIAGNOSIS — Z8249 Family history of ischemic heart disease and other diseases of the circulatory system: Secondary | ICD-10-CM | POA: Insufficient documentation

## 2022-06-30 ENCOUNTER — Telehealth: Payer: Self-pay

## 2022-06-30 DIAGNOSIS — I25118 Atherosclerotic heart disease of native coronary artery with other forms of angina pectoris: Secondary | ICD-10-CM

## 2022-06-30 NOTE — Telephone Encounter (Signed)
Called patient with results. Will place order for cardiac PET/CT.  Placed order for PET/CT. Will send instructions for test through mychart.

## 2022-06-30 NOTE — Telephone Encounter (Signed)
-----   Message from Josue Hector, MD sent at 06/29/2022  3:56 PM EDT ----- Calcium score > 400 with LAD/RCA predominance Needs cardiac PET/CT scan and can f/u with me after test

## 2022-07-05 ENCOUNTER — Ambulatory Visit (INDEPENDENT_AMBULATORY_CARE_PROVIDER_SITE_OTHER): Payer: BC Managed Care – PPO | Admitting: Internal Medicine

## 2022-07-06 NOTE — Telephone Encounter (Signed)
Patient is returning call.  °

## 2022-07-06 NOTE — Telephone Encounter (Signed)
Left message for patient to call back.   Patient called back. Was unable to talk to patient at the time. Called patient back. Patient had received letter for instructions and had no questions at this time. Patient was wondering when he would be scheduled. Informed patient that test would need to be cleared with his insurance before he will get a call to schedule. Patient verbalized understanding.

## 2022-07-20 ENCOUNTER — Ambulatory Visit (INDEPENDENT_AMBULATORY_CARE_PROVIDER_SITE_OTHER): Payer: BC Managed Care – PPO | Admitting: Internal Medicine

## 2022-07-20 ENCOUNTER — Encounter (INDEPENDENT_AMBULATORY_CARE_PROVIDER_SITE_OTHER): Payer: Self-pay

## 2022-07-26 ENCOUNTER — Ambulatory Visit (INDEPENDENT_AMBULATORY_CARE_PROVIDER_SITE_OTHER): Payer: BC Managed Care – PPO | Admitting: Adult Health

## 2022-07-26 ENCOUNTER — Telehealth: Payer: Self-pay | Admitting: Cardiovascular Disease

## 2022-07-26 ENCOUNTER — Encounter (INDEPENDENT_AMBULATORY_CARE_PROVIDER_SITE_OTHER): Payer: Self-pay | Admitting: Adult Health

## 2022-07-26 VITALS — BP 126/76 | HR 53 | Temp 97.6°F | Ht 71.0 in | Wt 270.0 lb

## 2022-07-26 DIAGNOSIS — Z6837 Body mass index (BMI) 37.0-37.9, adult: Secondary | ICD-10-CM | POA: Diagnosis not present

## 2022-07-26 DIAGNOSIS — R7303 Prediabetes: Secondary | ICD-10-CM | POA: Diagnosis not present

## 2022-07-26 DIAGNOSIS — Z6839 Body mass index (BMI) 39.0-39.9, adult: Secondary | ICD-10-CM

## 2022-07-26 DIAGNOSIS — E559 Vitamin D deficiency, unspecified: Secondary | ICD-10-CM | POA: Diagnosis not present

## 2022-07-26 MED ORDER — VITAMIN D (ERGOCALCIFEROL) 1.25 MG (50000 UNIT) PO CAPS: 50000.0000 [IU] | ORAL_CAPSULE | ORAL | 0 refills | Status: DC

## 2022-07-26 NOTE — Progress Notes (Unsigned)
WEIGHT SUMMARY AND BIOMETRICS  Vitals Temp: 97.6 F (36.4 C) BP: 126/76 Pulse Rate: (!) 53 SpO2: 97 %   Anthropometric Measurements Height:  (1.803 m) Weight: 270 lb (122.5 kg) BMI (Calculated): 37.67 Weight at Last Visit: 274lb Weight Lost Since Last Visit: 4lb Weight Gained Since Last Visit: 0 Starting Weight: 281lb Total Weight Loss (lbs): 5 lb (2.268 kg)   Body Composition  Body Fat %: 35.5 % Fat Mass (lbs): 96 lbs Muscle Mass (lbs): 165.8 lbs Total Body Water (lbs): 127.6 lbs Visceral Fat Rating : 20   Other Clinical Data Fasting: No Labs: No Today's Visit #: 3 Starting Date: 06/06/22    Chief Complaint:   OBESITY [name] [name] is here to discuss his progress with his obesity treatment plan. He is on the the Category 4 Plan and states he is following his eating plan approximately 70 % of the time.  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 Prediabetes Lab Results  Component Value Date   HGBA1C 5.9 (H) 06/06/2022     Latest Reference Range & Units 06/06/22 09:57  INSULIN 2.6 - 24.9 uIU/mL 25.0 (H)  (H): Data is abnormally high He denise family hx of T2D, endorses family hx of CAD  2. Vitamin D deficiency  Latest Reference Range & Units 06/06/22 09:57  Vitamin D, 25-Hydroxy 30.0 - 100.0 ng/mL 12.9 (L)  (L): Data is abnormally low 06/20/2022 started on Ergocalciferol- denies N/V/Muscle Weakness  Assessment/Plan:   1. Prediabetes Increase protein, decrease sugar/CHO Continue regular exercise  2. Vitamin D deficiency Refill - Vitamin D, Ergocalciferol, (DRISDOL) 1.25 MG (50000 UNIT) CAPS capsule; Take 1 capsule (50,000 Units total) by mouth every 7 (seven) days.  Dispense: 16 capsule; Refill: 0  3. Class 2 severe obesity with serious comorbidity and  body mass index (BMI) of 39.0 to 39.9 in adult, unspecified obesity type, CURRENT BMI 37.67  [name] [name] is currently in the action stage of change. As such, his goal is to continue with weight loss efforts. He has agreed to the Category 4 Plan.   Exercise goals: For substantial health benefits, adults should do at least 150 minutes (2 hours and 30 minutes) a week of moderate-intensity, or 75 minutes (1 hour and 15 minutes) a week of vigorous-intensity aerobic physical activity, or an equivalent combination of moderate- and vigorous-intensity aerobic activity. Aerobic activity should be performed in episodes of at least 10 minutes, and preferably, it should be spread throughout the week.  Behavioral modification strategies: increasing lean protein intake, decreasing simple carbohydrates, increasing vegetables, increasing water intake, no skipping meals, meal planning and cooking strategies, and planning for success.  [name] [name] has agreed to follow-up with our clinic in 4 weeks. He was informed of the importance of frequent follow-up visits to maximize his success with intensive lifestyle modifications for his multiple health conditions.   Objective:   Blood pressure 126/76, pulse (!) 53, temperature 97.6 F (36.4 C), height  (1.803 m), weight 270 lb (122.5 kg), SpO2 97 %. Body mass index is 37.66 kg/m.  General: Cooperative, alert, well developed, in no acute distress. HEENT: Conjunctivae and lids unremarkable. Cardiovascular: Regular rhythm.  Lungs: Normal work of breathing. Neurologic: No focal deficits.   Lab Results  Component Value Date   CREATININE 1.16 06/06/2022   BUN 13 06/06/2022  NA 137 06/06/2022   K 4.4 06/06/2022   CL 99 06/06/2022   CO2 21 06/06/2022   Lab Results  Component Value Date   ALT 39 06/06/2022   AST 23 06/06/2022   ALKPHOS 54 06/06/2022   BILITOT 0.5 06/06/2022   Lab Results  Component Value Date   HGBA1C 5.9 (H) 06/06/2022   Lab Results   Component Value Date   INSULIN 25.0 (H) 06/06/2022   Lab Results  Component Value Date   TSH 1.380 06/06/2022   Lab Results  Component Value Date   CHOL 173 06/06/2022   HDL 49 06/06/2022   LDLCALC 103 (H) 06/06/2022   TRIG 118 06/06/2022   Lab Results  Component Value Date   VD25OH 12.9 (L) 06/06/2022   Lab Results  Component Value Date   WBC 4.2 06/06/2022   HGB 16.4 06/06/2022   HCT 47.3 06/06/2022   MCV 91 06/06/2022   PLT 207 06/06/2022   No results found for: "IRON", "TIBC", "FERRITIN"   Attestation Statements:   Reviewed by clinician on day of visit: allergies, medications, problem list, medical history, surgical history, family history, social history, and previous encounter notes.  I have reviewed the above documentation for accuracy and completeness, and I agree with the above. -  Corbyn Wildey d. Schyler Counsell, NP-C

## 2022-07-26 NOTE — Telephone Encounter (Signed)
Patient PET scan has expires. New order needs to be put in, because is ready to schedule. Please advise

## 2022-08-10 ENCOUNTER — Encounter (INDEPENDENT_AMBULATORY_CARE_PROVIDER_SITE_OTHER): Payer: Self-pay | Admitting: Adult Health

## 2022-08-10 ENCOUNTER — Ambulatory Visit (INDEPENDENT_AMBULATORY_CARE_PROVIDER_SITE_OTHER): Payer: BC Managed Care – PPO | Admitting: Adult Health

## 2022-08-10 VITALS — BP 111/76 | HR 63 | Temp 97.5°F | Ht 71.0 in | Wt 269.0 lb

## 2022-08-10 DIAGNOSIS — E559 Vitamin D deficiency, unspecified: Secondary | ICD-10-CM

## 2022-08-10 DIAGNOSIS — R7303 Prediabetes: Secondary | ICD-10-CM | POA: Diagnosis not present

## 2022-08-10 DIAGNOSIS — Z6837 Body mass index (BMI) 37.0-37.9, adult: Secondary | ICD-10-CM | POA: Diagnosis not present

## 2022-08-10 NOTE — Progress Notes (Signed)
WEIGHT SUMMARY AND BIOMETRICS  Vitals Temp: (!) 97.5 F (36.4 C) BP: 111/76 Pulse Rate: 63 SpO2: 96 %   Anthropometric Measurements Height: 5\' 11"  (1.803 m) Weight: 269 lb (122 kg) BMI (Calculated): 37.53 Weight at Last Visit: 270lb Weight Lost Since Last Visit: 1lb Weight Gained Since Last Visit: 0 Starting Weight: 281lb Total Weight Loss (lbs): 6 lb (2.722 kg)   Body Composition  Body Fat %: 34.1 % Fat Mass (lbs): 92 lbs Muscle Mass (lbs): 169 lbs Total Body Water (lbs): 124 lbs Visceral Fat Rating : 20   Other Clinical Data Fasting: Yes Labs: No Today's Visit #: 4 Starting Date: 06/06/22    Chief Complaint:   OBESITY [name] [name] is here to discuss his progress with his obesity treatment plan. He is on the the Category 4 Plan and states he is following his eating plan approximately 50 % of the time.  He states he is not currently exercising due exacerbation of COPD sx's   Interim History:  [name] [name] reports chest tightness and wheezing the last 1.5 weeks, he has hx of COPD. COPD quit smoking 2015 with 29 packyear history  He is on Trelegy daily, Singulair daily, Ventolin PRN- used just moments prior to OV at [facility] today. He denies CP or severe dyspnea with exertion.  [name] [name] has recently ordered stationary bike and has an active subscription at [employer] (Exercise APP)  Subjective:   1. Prediabetes Lab Results  Component Value Date   HGBA1C 5.9 (H) 06/06/2022   He is not currently on any antidiabetic medications. He denies polyphagia.  2. Vitamin D deficiency  Latest Reference Range & Units 06/06/22 09:57  Vitamin D, 25-Hydroxy 30.0 - 100.0 ng/mL 12.9 (L)  (L): Data is abnormally low He was started on weekly Ergocalciferol on 06/20/2022 He endorses increase in fatigue with recent COPD exacerbation.  Assessment/Plan:   1. Prediabetes Continue to increase protein and decrease sugar/CHO Once COPD sx's resolve, start stationary bike  riding.  2. Vitamin D deficiency Continue weekly Ergocalciferol Check Labs this Spring  3. Class 2 severe obesity with serious comorbidity and body mass index (BMI) of 39.0 to 39.9 in adult, unspecified obesity type, CURRENT BMI 37.53  [name] [name] is currently in the action stage of change. As such, his goal is to continue with weight loss efforts. He has agreed to the Category 4 Plan.   Exercise goals: All adults should avoid inactivity. Some physical activity is better than none, and adults who participate in any amount of physical activity gain some health benefits.  Behavioral modification strategies: increasing lean protein intake, decreasing simple carbohydrates, increasing vegetables, increasing water intake, no skipping meals, meal planning and cooking strategies, and planning for success.  [name] [name] has agreed to follow-up with our clinic in 2 weeks. He was informed of the importance of frequent follow-up visits to maximize his success with intensive lifestyle modifications for his multiple health conditions.   Objective:   Blood pressure 111/76, pulse 63, temperature (!) 97.5 F (36.4 C), height 5\' 11"  (1.803 m), weight 269 lb (122 kg), SpO2 96 %. Body mass index is 37.52 kg/m.  General: Cooperative, alert, well developed, in no acute distress. HEENT: Conjunctivae and lids unremarkable. Cardiovascular: Regular rhythm.  Lungs: Normal work of breathing. Neurologic: No focal deficits.   Lab Results  Component Value Date   CREATININE 1.16 06/06/2022   BUN 13 06/06/2022   NA 137 06/06/2022   K 4.4 06/06/2022   CL 99 06/06/2022  CO2 21 06/06/2022   Lab Results  Component Value Date   ALT 39 06/06/2022   AST 23 06/06/2022   ALKPHOS 54 06/06/2022   BILITOT 0.5 06/06/2022   Lab Results  Component Value Date   HGBA1C 5.9 (H) 06/06/2022   Lab Results  Component Value Date   INSULIN 25.0 (H) 06/06/2022   Lab Results  Component Value Date   TSH 1.380 06/06/2022    Lab Results  Component Value Date   CHOL 173 06/06/2022   HDL 49 06/06/2022   LDLCALC 103 (H) 06/06/2022   TRIG 118 06/06/2022   Lab Results  Component Value Date   VD25OH 12.9 (L) 06/06/2022   Lab Results  Component Value Date   WBC 4.2 06/06/2022   HGB 16.4 06/06/2022   HCT 47.3 06/06/2022   MCV 91 06/06/2022   PLT 207 06/06/2022   No results found for: "IRON", "TIBC", "FERRITIN"  Attestation Statements:   Reviewed by clinician on day of visit: allergies, medications, problem list, medical history, surgical history, family history, social history, and previous encounter notes.  I have reviewed the above documentation for accuracy and completeness, and I agree with the above. -  Jonanthony Nahar d. Aviyanna Colbaugh, NP-C

## 2022-08-29 ENCOUNTER — Ambulatory Visit (INDEPENDENT_AMBULATORY_CARE_PROVIDER_SITE_OTHER): Payer: BC Managed Care – PPO | Admitting: Internal Medicine

## 2022-09-05 ENCOUNTER — Telehealth: Payer: Self-pay

## 2022-09-05 DIAGNOSIS — R931 Abnormal findings on diagnostic imaging of heart and coronary circulation: Secondary | ICD-10-CM

## 2022-09-05 DIAGNOSIS — R0600 Dyspnea, unspecified: Secondary | ICD-10-CM

## 2022-09-05 NOTE — Telephone Encounter (Signed)
-----   Message from Wendall Stade, MD sent at 09/05/2022  4:19 PM EDT ----- Regarding: RE: Electa Sniff see if we can do a lexiscan myovue instead for high calcium score and dyspnea ----- Message ----- From: Francine Graven Sent: 09/05/2022   3:34 PM EDT To: Wendall Stade, MD; Lennie Odor, RN; # Subject: Ollen Bowl                                    Good afternoon,   [employer] has dens Pet scan auth. Following are the details.  NIA has reviewed the clinical information submitted for this request and determined that the  information provided does not support medical necessity as defined in your Benefit Booklet or  Contract for the following:  After a review of the notes that were sent, the requested Heart PET Scan with CT for  Attenuation is denied at this time.  This request for Heart PET Scan with CT for Attenuation was denied because clinical  information needed to make a decision was not submitted by your physician. The following  clinical notes were requested: doctor's notes say why you cannot do a different heart test  where you walk with sound wave pictures (SE (Stress echocardiogram).   [employer] Clinical Guideline 079 for Heart PET Scan with CT for Attenuation was used to  make this decision. The definition of "medically necessary" in your Benefit Booklet is: Medical Necessity: Your health plan will provide coverage for medically necessary services  when it is determined that the medical criteria and guidelines below are met: General: ? medically appropriate for the symptoms and diagnosis, treatment of the condition,  illness, disease or injury ? provided for the diagnosis, or the direct care and treatment of the member's condition,  illness, disease or injury ? in accordance with generally accepted practice standards of good medical practice in the  community  ? not primarily for the convenience of the patient, the patient's family, the patient's  provider ?  service or supply must not be experimental, investigational or cosmetic in purpose. ##Only the member's medical condition is considered when deciding medical necessity. The  fact that a physician ordered, prescribed, recommended or approved a service or supply does  not, in itself, make the services medically necessary.  If the requesting provider would like to discuss this case with our reviewer please call [phone].  Thanks, [employer]

## 2022-09-05 NOTE — Telephone Encounter (Signed)
Left message for patient to call back. Will give patient instructions for lexiscan when he calls back. Placed order for lexiscan.

## 2022-09-07 ENCOUNTER — Encounter (INDEPENDENT_AMBULATORY_CARE_PROVIDER_SITE_OTHER): Payer: BC Managed Care – PPO

## 2022-09-26 ENCOUNTER — Other Ambulatory Visit: Payer: Self-pay | Admitting: Acute Care

## 2022-09-26 DIAGNOSIS — Z87891 Personal history of nicotine dependence: Secondary | ICD-10-CM

## 2022-09-26 DIAGNOSIS — Z122 Encounter for screening for malignant neoplasm of respiratory organs: Secondary | ICD-10-CM

## 2022-10-17 ENCOUNTER — Other Ambulatory Visit: Payer: Self-pay | Admitting: Pulmonary Disease

## 2022-10-17 ENCOUNTER — Other Ambulatory Visit: Payer: Self-pay | Admitting: Nurse Practitioner

## 2022-10-17 DIAGNOSIS — J432 Centrilobular emphysema: Secondary | ICD-10-CM

## 2022-12-12 ENCOUNTER — Ambulatory Visit: Payer: BC Managed Care – PPO

## 2023-01-17 ENCOUNTER — Other Ambulatory Visit: Payer: Self-pay | Admitting: Pulmonary Disease

## 2023-01-17 DIAGNOSIS — J4520 Mild intermittent asthma, uncomplicated: Secondary | ICD-10-CM

## 2023-02-09 ENCOUNTER — Encounter: Payer: Self-pay | Admitting: Adult Health

## 2023-02-09 ENCOUNTER — Telehealth: Payer: BC Managed Care – PPO | Admitting: Adult Health

## 2023-02-09 DIAGNOSIS — J441 Chronic obstructive pulmonary disease with (acute) exacerbation: Secondary | ICD-10-CM | POA: Diagnosis not present

## 2023-02-09 DIAGNOSIS — Z87891 Personal history of nicotine dependence: Secondary | ICD-10-CM | POA: Diagnosis not present

## 2023-02-09 MED ORDER — DOXYCYCLINE HYCLATE 100 MG PO TABS: 100.0000 mg | ORAL_TABLET | Freq: Two times a day (BID) | ORAL | 0 refills | Status: DC

## 2023-02-09 MED ORDER — PREDNISONE 10 MG PO TABS: ORAL_TABLET | ORAL | 0 refills | Status: DC
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
## 2023-02-09 NOTE — Patient Instructions (Signed)
Begin doxycycline 100 mg twice daily for 7 days.,  Take with food.  Use sunscreen. Prednisone taper in the next week Mucinex DM twice daily Albuterol inhaler as needed Continue on Trelegy inhaler 1 puff daily, rinse after use Contact the lung cancer CT screening program to reschedule your CT scan Follow-up with Dr. Francine Graven in 3 months and As needed   Please contact office for sooner follow up if symptoms do not improve or worsen or seek emergency care

## 2023-03-06 ENCOUNTER — Other Ambulatory Visit: Payer: Self-pay | Admitting: Pulmonary Disease

## 2023-04-06 ENCOUNTER — Emergency Department (HOSPITAL_COMMUNITY): Payer: BC Managed Care – PPO

## 2023-04-06 ENCOUNTER — Emergency Department: Payer: BC Managed Care – PPO

## 2023-04-06 ENCOUNTER — Observation Stay (HOSPITAL_COMMUNITY)
Admission: EM | Admit: 2023-04-06 | Discharge: 2023-04-08 | Disposition: A | Discharge status: Home / Self Care | Payer: BC Managed Care – PPO | Attending: Surgery | Admitting: Surgery

## 2023-04-06 ENCOUNTER — Emergency Department
Admission: EM | Admit: 2023-04-06 | Discharge: 2023-04-06 | Disposition: A | Discharge status: Nursing Home (Includes ICF) | Payer: BC Managed Care – PPO | Attending: Emergency Medicine | Admitting: Emergency Medicine

## 2023-04-06 ENCOUNTER — Encounter: Payer: Self-pay | Admitting: Emergency Medicine

## 2023-04-06 ENCOUNTER — Other Ambulatory Visit: Payer: Self-pay

## 2023-04-06 ENCOUNTER — Encounter (HOSPITAL_COMMUNITY): Payer: Self-pay | Admitting: Emergency Medicine

## 2023-04-06 DIAGNOSIS — S27899A Unspecified injury of other specified intrathoracic organs, initial encounter: Secondary | ICD-10-CM | POA: Diagnosis not present

## 2023-04-06 DIAGNOSIS — Z79899 Other long term (current) drug therapy: Secondary | ICD-10-CM | POA: Insufficient documentation

## 2023-04-06 DIAGNOSIS — S52615A Nondisplaced fracture of left ulna styloid process, initial encounter for closed fracture: Secondary | ICD-10-CM

## 2023-04-06 DIAGNOSIS — S2220XA Unspecified fracture of sternum, initial encounter for closed fracture: Secondary | ICD-10-CM | POA: Insufficient documentation

## 2023-04-06 DIAGNOSIS — S82141A Displaced bicondylar fracture of right tibia, initial encounter for closed fracture: Secondary | ICD-10-CM | POA: Insufficient documentation

## 2023-04-06 DIAGNOSIS — Y92410 Unspecified street and highway as the place of occurrence of the external cause: Secondary | ICD-10-CM | POA: Insufficient documentation

## 2023-04-06 DIAGNOSIS — Z87891 Personal history of nicotine dependence: Secondary | ICD-10-CM | POA: Insufficient documentation

## 2023-04-06 DIAGNOSIS — T1490XA Injury, unspecified, initial encounter: Principal | ICD-10-CM | POA: Diagnosis present

## 2023-04-06 DIAGNOSIS — S2222XA Fracture of body of sternum, initial encounter for closed fracture: Secondary | ICD-10-CM | POA: Insufficient documentation

## 2023-04-06 DIAGNOSIS — Z23 Encounter for immunization: Secondary | ICD-10-CM | POA: Insufficient documentation

## 2023-04-06 DIAGNOSIS — M25561 Pain in right knee: Secondary | ICD-10-CM | POA: Diagnosis present

## 2023-04-06 LAB — COMPREHENSIVE METABOLIC PANEL
ALT: 38 U/L (ref 0–44)
AST: 24 U/L (ref 15–41)
Albumin: 4 g/dL (ref 3.5–5.0)
Alkaline Phosphatase: 44 U/L (ref 38–126)
Anion gap: 10 (ref 5–15)
BUN: 17 mg/dL (ref 6–20)
CO2: 21 mmol/L — ABNORMAL LOW (ref 22–32)
Calcium: 9.1 mg/dL (ref 8.9–10.3)
Chloride: 106 mmol/L (ref 98–111)
Creatinine, Ser: 1.21 mg/dL (ref 0.61–1.24)
GFR, Estimated: >60 mL/min (ref >60)
Glucose, Bld: 98 mg/dL (ref 70–99)
Potassium: 3.9 mmol/L (ref 3.5–5.1)
Sodium: 137 mmol/L (ref 135–145)
Total Bilirubin: 0.7 mg/dL (ref <1.2)
Total Protein: 6.9 g/dL (ref 6.5–8.1)

## 2023-04-06 LAB — CBC WITH DIFFERENTIAL/PLATELET
Abs Immature Granulocytes: 0.02 10*3/uL (ref 0.00–0.07)
Basophils Absolute: 0 10*3/uL (ref 0.0–0.1)
Basophils Relative: 1 %
Eosinophils Absolute: 0.1 10*3/uL (ref 0.0–0.5)
Eosinophils Relative: 2 %
HCT: 45.1 % (ref 39.0–52.0)
Hemoglobin: 15.9 g/dL (ref 13.0–17.0)
Immature Granulocytes: 0 %
Lymphocytes Relative: 24 %
Lymphs Abs: 1.6 10*3/uL (ref 0.7–4.0)
MCH: 31.5 pg (ref 26.0–34.0)
MCHC: 35.3 g/dL (ref 30.0–36.0)
MCV: 89.5 fL (ref 80.0–100.0)
Monocytes Absolute: 0.4 10*3/uL (ref 0.1–1.0)
Monocytes Relative: 6 %
Neutro Abs: 4.4 10*3/uL (ref 1.7–7.7)
Neutrophils Relative %: 67 %
Platelets: 217 10*3/uL (ref 150–400)
RBC: 5.04 MIL/uL (ref 4.22–5.81)
RDW: 11.8 % (ref 11.5–15.5)
WBC: 6.6 10*3/uL (ref 4.0–10.5)
nRBC: 0 % (ref 0.0–0.2)

## 2023-04-06 LAB — TROPONIN I (HIGH SENSITIVITY): Troponin I (High Sensitivity): 5 ng/L (ref <18)

## 2023-04-06 MED ORDER — FLUTICASONE FUROATE-VILANTEROL 100-25 MCG/ACT IN AEPB
1.0000 | INHALATION_SPRAY | Freq: Every day | RESPIRATORY_TRACT | Status: DC
  Administered 2023-04-08: 1 via RESPIRATORY_TRACT
  Filled 2023-04-06 (×2): qty 28

## 2023-04-06 MED ORDER — IOHEXOL 300 MG/ML  SOLN
100.0000 mL | Freq: Once | INTRAMUSCULAR | Status: AC | PRN
  Administered 2023-04-06: 100 mL via INTRAVENOUS

## 2023-04-06 MED ORDER — IOHEXOL 300 MG/ML  SOLN
60.0000 mL | Freq: Once | INTRAMUSCULAR | Status: AC | PRN
  Administered 2023-04-06: 60 mL via INTRAVENOUS

## 2023-04-06 MED ORDER — PANTOPRAZOLE SODIUM 40 MG PO TBEC
40.0000 mg | DELAYED_RELEASE_TABLET | Freq: Two times a day (BID) | ORAL | Status: DC
  Administered 2023-04-06 – 2023-04-08 (×4): 40 mg via ORAL
  Filled 2023-04-06 (×4): qty 1

## 2023-04-06 MED ORDER — FOLIC ACID 1 MG PO TABS
1.0000 mg | ORAL_TABLET | Freq: Every day | ORAL | Status: DC
  Administered 2023-04-07 – 2023-04-08 (×2): 1 mg via ORAL
  Filled 2023-04-06 (×2): qty 1

## 2023-04-06 MED ORDER — ONDANSETRON HCL 4 MG/2ML IJ SOLN: 4.0000 mg | Freq: Four times a day (QID) | INTRAMUSCULAR | Status: DC | PRN

## 2023-04-06 MED ORDER — OXYCODONE HCL 5 MG PO TABS
5.0000 mg | ORAL_TABLET | ORAL | Status: DC | PRN
  Administered 2023-04-08: 5 mg via ORAL
  Filled 2023-04-06 (×3): qty 1

## 2023-04-06 MED ORDER — THIAMINE HCL 100 MG/ML IJ SOLN: 100.0000 mg | Freq: Every day | INTRAMUSCULAR | Status: DC

## 2023-04-06 MED ORDER — ACETAMINOPHEN 500 MG PO TABS
1000.0000 mg | ORAL_TABLET | Freq: Four times a day (QID) | ORAL | Status: DC
  Administered 2023-04-06 – 2023-04-08 (×7): 1000 mg via ORAL
  Filled 2023-04-06 (×7): qty 2

## 2023-04-06 MED ORDER — LORAZEPAM 1 MG PO TABS
1.0000 mg | ORAL_TABLET | ORAL | Status: DC | PRN
Start: 2023-04-06 — End: 2023-04-06

## 2023-04-06 MED ORDER — DEXTROSE-SODIUM CHLORIDE 5-0.45 % IV SOLN: INTRAVENOUS | Status: DC

## 2023-04-06 MED ORDER — DOCUSATE SODIUM 100 MG PO CAPS
100.0000 mg | ORAL_CAPSULE | Freq: Two times a day (BID) | ORAL | Status: DC
  Administered 2023-04-06 – 2023-04-08 (×4): 100 mg via ORAL
  Filled 2023-04-06 (×4): qty 1

## 2023-04-06 MED ORDER — LIDOCAINE 5 % EX PTCH
1.0000 | MEDICATED_PATCH | CUTANEOUS | Status: DC
Start: 2023-04-06 — End: 2023-04-08
  Administered 2023-04-06 – 2023-04-07 (×2): 1 via TRANSDERMAL
  Filled 2023-04-06 (×2): qty 1

## 2023-04-06 MED ORDER — LORAZEPAM 2 MG/ML IJ SOLN: 1.0000 mg | INTRAMUSCULAR | Status: DC | PRN

## 2023-04-06 MED ORDER — POLYETHYLENE GLYCOL 3350 17 G PO PACK: 17.0000 g | PACK | Freq: Every day | ORAL | Status: DC | PRN

## 2023-04-06 MED ORDER — ONDANSETRON 4 MG PO TBDP: 4.0000 mg | ORAL_TABLET | Freq: Four times a day (QID) | ORAL | Status: DC | PRN

## 2023-04-06 MED ORDER — ALBUTEROL SULFATE (2.5 MG/3ML) 0.083% IN NEBU: 2.5000 mg | INHALATION_SOLUTION | Freq: Four times a day (QID) | RESPIRATORY_TRACT | Status: DC | PRN

## 2023-04-06 MED ORDER — THIAMINE MONONITRATE 100 MG PO TABS: 100.0000 mg | ORAL_TABLET | Freq: Every day | ORAL | Status: DC

## 2023-04-06 MED ORDER — SODIUM CHLORIDE 0.9 % IV BOLUS: 1000.0000 mL | Freq: Once | INTRAVENOUS | Status: DC

## 2023-04-06 MED ORDER — TETANUS-DIPHTH-ACELL PERTUSSIS 5-2.5-18.5 LF-MCG/0.5 IM SUSY
0.5000 mL | PREFILLED_SYRINGE | Freq: Once | INTRAMUSCULAR | Status: AC
  Administered 2023-04-06: 0.5 mL via INTRAMUSCULAR
  Filled 2023-04-06: qty 0.5

## 2023-04-06 MED ORDER — UMECLIDINIUM BROMIDE 62.5 MCG/ACT IN AEPB
1.0000 | INHALATION_SPRAY | Freq: Every day | RESPIRATORY_TRACT | Status: DC
  Administered 2023-04-08: 1 via RESPIRATORY_TRACT
  Filled 2023-04-06 (×2): qty 7

## 2023-04-06 MED ORDER — MORPHINE SULFATE (PF) 4 MG/ML IV SOLN
4.0000 mg | Freq: Once | INTRAVENOUS | Status: AC
  Administered 2023-04-06: 4 mg via INTRAVENOUS
  Filled 2023-04-06: qty 1

## 2023-04-06 MED ORDER — ENOXAPARIN SODIUM 30 MG/0.3ML IJ SOSY
30.0000 mg | PREFILLED_SYRINGE | Freq: Two times a day (BID) | INTRAMUSCULAR | Status: DC
  Administered 2023-04-07 – 2023-04-08 (×3): 30 mg via SUBCUTANEOUS
  Filled 2023-04-06 (×3): qty 0.3

## 2023-04-06 MED ORDER — METHOCARBAMOL 500 MG PO TABS
500.0000 mg | ORAL_TABLET | Freq: Three times a day (TID) | ORAL | Status: DC
  Administered 2023-04-06 – 2023-04-08 (×5): 500 mg via ORAL
  Filled 2023-04-06 (×5): qty 1

## 2023-04-06 MED ORDER — METHOCARBAMOL 1000 MG/10ML IJ SOLN
500.0000 mg | Freq: Three times a day (TID) | INTRAMUSCULAR | Status: DC
  Filled 2023-04-06: qty 10

## 2023-04-06 MED ORDER — HYDRALAZINE HCL 20 MG/ML IJ SOLN: 10.0000 mg | INTRAMUSCULAR | Status: DC | PRN

## 2023-04-06 MED ORDER — ADULT MULTIVITAMIN W/MINERALS CH: 1.0000 | ORAL_TABLET | Freq: Every day | ORAL | Status: DC

## 2023-04-06 MED ORDER — HYDROMORPHONE HCL 1 MG/ML IJ SOLN: 1.0000 mg | INTRAMUSCULAR | Status: DC | PRN

## 2023-04-06 MED ORDER — GABAPENTIN 300 MG PO CAPS
300.0000 mg | ORAL_CAPSULE | Freq: Three times a day (TID) | ORAL | Status: DC
Start: 2023-04-06 — End: 2023-04-08
  Administered 2023-04-06 – 2023-04-08 (×5): 300 mg via ORAL
  Filled 2023-04-06 (×5): qty 1

## 2023-04-06 MED ORDER — MONTELUKAST SODIUM 10 MG PO TABS
10.0000 mg | ORAL_TABLET | Freq: Every day | ORAL | Status: DC
  Administered 2023-04-06 – 2023-04-07 (×2): 10 mg via ORAL
  Filled 2023-04-06 (×2): qty 1

## 2023-04-06 NOTE — ED Notes (Signed)
Ortho tech placing splint. 
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
## 2023-04-06 NOTE — Progress Notes (Addendum)
Orthopedic Tech Progress Note Patient Details:  [name] [name] [birth year] [id number]  Volar splint applied to patients LUE. Knee immobilizer applied to RLE    Ortho Devices Type of Ortho Device: Volar splint Ortho Device/Splint Location: LUE Ortho Device/Splint Interventions: Ordered, Application   Post Interventions Patient Tolerated: Well Instructions Provided: Adjustment of device, Care of device  Diannia Ruder 04/06/2023, 11:01 PM
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
## 2023-04-06 NOTE — ED Notes (Signed)
Alex, RN called RT to complete incentive spirometer

## 2023-04-06 NOTE — Progress Notes (Signed)
   04/06/23 2304  Incentive Spirometry Tx  Level of Service Independent  Frequency q1hr W/A  Respiratory Effort Dyspnea with exertion  Treatment Tolerance Tolerated well  IS - Achieved (mL) (RN, NT, or RT) 1250 mL   Pt educated on how to do IS and left at bedside.
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
## 2023-04-06 NOTE — ED Notes (Signed)
Trinna Post, RN contacted Ortho tech to place short arm splint. They state they will be down to place

## 2023-04-07 LAB — CBC
HCT: 44.4 % (ref 39.0–52.0)
Hemoglobin: 15.5 g/dL (ref 13.0–17.0)
MCH: 31.4 pg (ref 26.0–34.0)
MCHC: 34.9 g/dL (ref 30.0–36.0)
MCV: 89.9 fL (ref 80.0–100.0)
Platelets: 217 10*3/uL (ref 150–400)
RBC: 4.94 MIL/uL (ref 4.22–5.81)
RDW: 12.1 % (ref 11.5–15.5)
WBC: 7.5 10*3/uL (ref 4.0–10.5)
nRBC: 0 % (ref 0.0–0.2)

## 2023-04-07 LAB — BASIC METABOLIC PANEL
Anion gap: 10 (ref 5–15)
BUN: 13 mg/dL (ref 6–20)
CO2: 21 mmol/L — ABNORMAL LOW (ref 22–32)
Calcium: 8.7 mg/dL — ABNORMAL LOW (ref 8.9–10.3)
Chloride: 104 mmol/L (ref 98–111)
Creatinine, Ser: 1.39 mg/dL — ABNORMAL HIGH (ref 0.61–1.24)
GFR, Estimated: >60 mL/min (ref >60)
Glucose, Bld: 120 mg/dL — ABNORMAL HIGH (ref 70–99)
Potassium: 3.9 mmol/L (ref 3.5–5.1)
Sodium: 135 mmol/L (ref 135–145)

## 2023-04-07 LAB — HIV ANTIBODY (ROUTINE TESTING W REFLEX): HIV Screen 4th Generation wRfx: NONREACTIVE
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
## 2023-04-07 NOTE — Plan of Care (Signed)
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
## 2023-04-07 NOTE — TOC CAGE-AID Note (Signed)
Transition of Care [facility]) - CAGE-AID Screening   Patient Details  Name: [name] [name] MRN: [id number] Date of Birth: 01/14/1971  Transition of Care [facility]) CM/SW Contact:    Judie Bonus, RN Phone Number: 04/07/2023, 6:03 AM   Clinical Narrative:  Pt denies current tobacco usage. Denies etoh, elicit substance usage. No resources needed  CAGE-AID Screening:    Have You Ever Felt You Ought to Cut Down on Your Drinking or Drug Use?: No Have People Annoyed You By Critizing Your Drinking Or Drug Use?: No Have You Felt Bad Or Guilty About Your Drinking Or Drug Use?: No Have You Ever Had a Drink or Used Drugs First Thing In The Morning to Steady Your Nerves or to Get Rid of a Hangover?: No CAGE-AID Score: 0  Substance Abuse Education Offered: No

## 2023-04-07 NOTE — Progress Notes (Deleted)
PT Cancellation Note  Patient Details Name: [name] [name] MRN: [id number] DOB: 10/07/1970   Cancelled Treatment:    Reason Eval/Treat Not Completed: Medical issues which prohibited therapy. Hand surgery consulted, awaiting recommendations for L UE WB precautions. Acute PT to follow.  [name], PT, DPT Secure Chat Preferred  Rehab Office [phone]    [name] 04/07/2023, 9:43 AM

## 2023-04-07 NOTE — ED Notes (Signed)
PO fluids given; no other needs at this time
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
## 2023-04-07 NOTE — Progress Notes (Signed)
Orthopedic Tech Progress Note Patient Details:  DEMICO DREWETT [birth year] [id number] Bledsoe hinged knee brace has been ordered from [facility]  Patient ID: [name] [name], male   DOB: Jul 05, 1970, 52 y.o.   MRN: [id number]  Genelle Bal Jacqueline Delapena 04/07/2023, 8:30 AM
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
## 2023-04-08 LAB — BASIC METABOLIC PANEL
Anion gap: 11 (ref 5–15)
BUN: 12 mg/dL (ref 6–20)
CO2: 23 mmol/L (ref 22–32)
Calcium: 8.4 mg/dL — ABNORMAL LOW (ref 8.9–10.3)
Chloride: 103 mmol/L (ref 98–111)
Creatinine, Ser: 1.29 mg/dL — ABNORMAL HIGH (ref 0.61–1.24)
GFR, Estimated: >60 mL/min (ref >60)
Glucose, Bld: 120 mg/dL — ABNORMAL HIGH (ref 70–99)
Potassium: 3.6 mmol/L (ref 3.5–5.1)
Sodium: 137 mmol/L (ref 135–145)

## 2023-04-08 MED ORDER — DOCUSATE SODIUM 100 MG PO CAPS: 100.0000 mg | ORAL_CAPSULE | Freq: Two times a day (BID) | ORAL | 0 refills | Status: DC

## 2023-04-08 MED ORDER — ACETAMINOPHEN 500 MG PO TABS: 1000.0000 mg | ORAL_TABLET | Freq: Three times a day (TID) | ORAL | 0 refills | Status: AC | PRN

## 2023-04-08 MED ORDER — OXYCODONE HCL 5 MG PO TABS: 5.0000 mg | ORAL_TABLET | Freq: Four times a day (QID) | ORAL | 0 refills | Status: AC | PRN

## 2023-04-08 MED ORDER — POLYETHYLENE GLYCOL 3350 17 G PO PACK: 17.0000 g | PACK | Freq: Every day | ORAL | Status: AC | PRN

## 2023-04-08 MED ORDER — GABAPENTIN 300 MG PO CAPS: 300.0000 mg | ORAL_CAPSULE | Freq: Three times a day (TID) | ORAL | 0 refills | Status: DC

## 2023-04-08 MED ORDER — METHOCARBAMOL 500 MG PO TABS: 500.0000 mg | ORAL_TABLET | Freq: Three times a day (TID) | ORAL | 0 refills | Status: AC | PRN
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
## 2023-04-08 NOTE — Discharge Instructions (Addendum)
° °  [facility] DISCHARGE INSTRUCTIONS  Activity Do not bear weight on your right leg. You should keep your right leg in a knee immobilizer. You may bear weight as tolerated on your left wrist and use it as normal for activities. You do not need to wear a splint on your wrist, but may choose to do so for comfort. Avoid heavy lifting greater than 10-15 pounds for the next 6 weeks.  Medications A  prescription for pain medication may be given to you upon discharge.  Take your pain medication as prescribed, if needed.  If narcotic pain medicine is not needed, then you may take acetaminophen (Tylenol) or ibuprofen (Advil) as needed. It is common to experience some constipation if taking pain medication.  Increasing fluid intake and taking a stool softener (such as Colace) will usually help or prevent this problem from occurring.  A mild laxative (Milk of Magnesia or Miralax) should be taken according to package directions if there are no bowel movements after 48 hours. Take your usually prescribed medications unless otherwise directed. If you need a refill on your pain medication, please contact your pharmacy.  They will contact our office to request authorization. Prescriptions will not be filled after 5 pm or on weekends.  When to Call us: Fever greater than 100.5 New redness, drainage, or swelling at incision site Severe uncontrolled pain Shortness of breath  Follow-up - You should follow up with orthopedic surgeon Dr. Blanchie Dessert in 2 weeks for your right leg fracture. - You should follow up with hand surgery (Dr. Aundria Rud) in 2 weeks for your left wrist fracture.   The clinic staff is available to answer your questions during regular business hours.  Please dont hesitate to call and ask to speak to one of the nurses for clinical concerns.  If you have a medical emergency, go to the nearest emergency room or call 911.  A surgeon from [facility] is always on call at the  hospital  [address], [place], [place]  [address] ?  [address] [address], [place], [place]   [address] [phone] ? Toll Free: [phone] ? FAX [phone] Web site: [email or web address]

## 2023-04-08 NOTE — Plan of Care (Signed)

## 2023-04-08 NOTE — Progress Notes (Signed)
[name] [name] to be D/C'd  per MD order.  Discussed with the patient and all questions fully answered.  VSS, Skin clean, dry and intact without evidence of skin break down, no evidence of skin tears noted.  IV catheter discontinued intact. Site without signs and symptoms of complications. Dressing and pressure applied.  An After Visit Summary was printed and given to the patient. Patient received his equipment for home  D/c education completed with patient/family including follow up instructions, medication list, d/c activities limitations if indicated, with other d/c instructions as indicated by MD - patient able to verbalize understanding, all questions fully answered.   Patient instructed to return to ED, call 911, or call MD for any changes in condition.   Patient to be escorted via WC, and D/C home via private auto.
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
## 2023-04-08 NOTE — TOC Transition Note (Signed)
Transition of Care [facility]) - Discharge Note   Patient Details  Name: [name] [name] MRN: [id number] Date of Birth: [birth year]  Transition of Care [facility] CM/SW Contact:  Ronny Bacon, RN Phone Number: 04/08/2023, 10:34 AM   Clinical Narrative:     Patient is being discharged today. Spoke with patient by phone, confirms able to get to outpatient rehab. Patient wants to use [facility]. Referral number [id number] completed. Call to kim with [facility] to inform that patient is being discharged today and does not have his DME. Kim to arrange delivery of DME to bedside.        Patient Goals and CMS Choice            Discharge Placement                       Discharge Plan and Services Additional resources added to the After Visit Summary for                  DME Arranged: Tub bench, Wheelchair manual DME Agency: [employer] Date DME Agency Contacted: 04/07/23 Time DME Agency Contacted: 1320 Representative spoke with at DME Agency: Selena Batten            Social Drivers of Health (SDOH) Interventions SDOH Screenings   Food Insecurity: No Food Insecurity (04/07/2023)  Housing: Low Risk  (04/07/2023)  Transportation Needs: No Transportation Needs (04/07/2023)  Utilities: Not At Risk (04/07/2023)  Tobacco Use: Medium Risk (04/06/2023)     Readmission Risk Interventions     No data to display

## 2023-04-08 NOTE — Plan of Care (Signed)
  Problem: Education: Goal: Knowledge of General Education information will improve Description: Including pain rating scale, medication(s)/side effects and non-pharmacologic comfort measures Outcome: Progressing   Problem: Activity: Goal: Risk for activity intolerance will decrease Outcome: Progressing   Problem: Nutrition: Goal: Adequate nutrition will be maintained Outcome: Progressing   Problem: Pain Management: Goal: General experience of comfort will improve Outcome: Progressing
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
## 2023-05-21 ENCOUNTER — Ambulatory Visit: Payer: BC Managed Care – PPO | Attending: Orthopedic Surgery | Admitting: Physical Therapy

## 2023-05-21 ENCOUNTER — Encounter: Payer: Self-pay | Admitting: Physical Therapy

## 2023-05-21 DIAGNOSIS — M25561 Pain in right knee: Secondary | ICD-10-CM | POA: Insufficient documentation

## 2023-05-21 DIAGNOSIS — M6281 Muscle weakness (generalized): Secondary | ICD-10-CM | POA: Insufficient documentation

## 2023-05-21 DIAGNOSIS — M25661 Stiffness of right knee, not elsewhere classified: Secondary | ICD-10-CM | POA: Diagnosis present

## 2023-05-21 DIAGNOSIS — R262 Difficulty in walking, not elsewhere classified: Secondary | ICD-10-CM | POA: Diagnosis present

## 2023-05-21 DIAGNOSIS — R29898 Other symptoms and signs involving the musculoskeletal system: Secondary | ICD-10-CM | POA: Diagnosis present
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
## 2023-05-22 ENCOUNTER — Telehealth: Payer: Self-pay | Admitting: Physical Therapy

## 2023-05-22 NOTE — Telephone Encounter (Signed)
05/22/23 3:09 PM  Called [facility] at [phone].   Spoke with [employer] Public affairs consultant) and asked for specific protocol or weight-bearing restrictions/precautions that his provider would like [place] to follow during rehab. Also requested the last clinic note with Dr. Blanchie Dessert. Bjorn Loser took the message and said Dr. Stevphen Rochester or his team will call us back or fax [place] the information.   Monetta Lick [place], SPT [employer] DPTE

## 2023-05-24 ENCOUNTER — Encounter: Payer: Self-pay | Admitting: Physical Therapy

## 2023-05-24 ENCOUNTER — Ambulatory Visit: Payer: BC Managed Care – PPO | Admitting: Physical Therapy

## 2023-05-24 DIAGNOSIS — M25561 Pain in right knee: Secondary | ICD-10-CM

## 2023-05-24 DIAGNOSIS — M25661 Stiffness of right knee, not elsewhere classified: Secondary | ICD-10-CM

## 2023-05-24 DIAGNOSIS — R29898 Other symptoms and signs involving the musculoskeletal system: Secondary | ICD-10-CM

## 2023-05-24 DIAGNOSIS — R262 Difficulty in walking, not elsewhere classified: Secondary | ICD-10-CM

## 2023-05-24 DIAGNOSIS — M6281 Muscle weakness (generalized): Secondary | ICD-10-CM
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
## 2023-05-26 ENCOUNTER — Other Ambulatory Visit: Payer: Self-pay | Admitting: Nurse Practitioner

## 2023-05-26 DIAGNOSIS — J432 Centrilobular emphysema: Secondary | ICD-10-CM

## 2023-05-29 ENCOUNTER — Ambulatory Visit: Payer: BC Managed Care – PPO | Admitting: Physical Therapy

## 2023-05-29 ENCOUNTER — Encounter: Payer: Self-pay | Admitting: Physical Therapy

## 2023-05-29 ENCOUNTER — Telehealth: Payer: Self-pay | Admitting: Cardiovascular Disease

## 2023-05-29 ENCOUNTER — Ambulatory Visit: Payer: BC Managed Care – PPO | Attending: Cardiology | Admitting: Cardiology

## 2023-05-29 VITALS — BP 159/93 | HR 107 | Ht 71.0 in | Wt 301.2 lb

## 2023-05-29 DIAGNOSIS — I25118 Atherosclerotic heart disease of native coronary artery with other forms of angina pectoris: Secondary | ICD-10-CM

## 2023-05-29 DIAGNOSIS — R030 Elevated blood-pressure reading, without diagnosis of hypertension: Secondary | ICD-10-CM

## 2023-05-29 DIAGNOSIS — M6281 Muscle weakness (generalized): Secondary | ICD-10-CM

## 2023-05-29 DIAGNOSIS — Z79899 Other long term (current) drug therapy: Secondary | ICD-10-CM

## 2023-05-29 DIAGNOSIS — E785 Hyperlipidemia, unspecified: Secondary | ICD-10-CM

## 2023-05-29 DIAGNOSIS — M25561 Pain in right knee: Secondary | ICD-10-CM | POA: Diagnosis not present

## 2023-05-29 DIAGNOSIS — R29898 Other symptoms and signs involving the musculoskeletal system: Secondary | ICD-10-CM

## 2023-05-29 DIAGNOSIS — J449 Chronic obstructive pulmonary disease, unspecified: Secondary | ICD-10-CM

## 2023-05-29 DIAGNOSIS — R262 Difficulty in walking, not elsewhere classified: Secondary | ICD-10-CM

## 2023-05-29 DIAGNOSIS — M25661 Stiffness of right knee, not elsewhere classified: Secondary | ICD-10-CM

## 2023-05-29 DIAGNOSIS — R931 Abnormal findings on diagnostic imaging of heart and coronary circulation: Secondary | ICD-10-CM | POA: Diagnosis not present

## 2023-05-29 DIAGNOSIS — R0602 Shortness of breath: Secondary | ICD-10-CM

## 2023-05-29 MED ORDER — ASPIRIN 81 MG PO TBEC
81.0000 mg | DELAYED_RELEASE_TABLET | Freq: Every day | ORAL | Status: AC
Start: 2023-05-29 — End: ?

## 2023-05-29 MED ORDER — ATORVASTATIN CALCIUM 20 MG PO TABS: 20.0000 mg | ORAL_TABLET | Freq: Every day | ORAL | 3 refills | Status: DC

## 2023-05-29 NOTE — Patient Instructions (Addendum)
 Medication Instructions:  Your physician recommends the following medication changes.  START TAKING: Aspirin 81 mg daily Atorvastatin 20 mg daily  *If you need a refill on your cardiac medications before your next appointment, please call your pharmacy*   Lab Work: Your provider would like for you to return in 2 months to have the following labs drawn: hepatic function, and lipid panels.   Please go to [facility] [address] ([facility]) [address], [place] [address] You do not need an appointment.  They are open from 8 am- 4:30 pm.  Lunch from 1:00 pm- 2:00 pm You DO  need to be fasting.   You may also go to one of the following [facility]:  [address] [place], [place] [address] Phone: [phone] Lab hours: Mon-Fri 8 am- 5 pm    Lunch 12 pm- 1 pm  [address] [place],  [place]  [address]  [place] Phone: [phone] Lab hours: 7 am- 4 pm Lunch 12 pm-1 pm   [address] [place],  [place]  [address]  [place] Phone: [phone] Lab hours: Mon-Fri 8 am- 5 pm    Lunch 12 pm- 1 pm   If you have labs (blood work) drawn today and your tests are completely normal, you will receive your results only by: MyChart Message (if you have MyChart) OR A paper copy in the mail If you have any lab test that is abnormal or we need to change your treatment, we will call you to review the results.   Testing/Procedures: Your physician has requested that you have an echocardiogram. Echocardiography is a painless test that uses sound waves to create images of your heart. It provides your doctor with information about the size and shape of your heart and how well your heart's chambers and valves are working.   You may receive an ultrasound enhancing agent through an IV if needed to better visualize your heart during the echo. This procedure takes approximately one hour.  There are no restrictions for this procedure.  This will take place at [address] [facility]  [facility]) [address], [place] [address]  Please note: We ask at that you not bring children with you during ultrasound (echo/ vascular) testing. Due to room size and safety concerns, children are not allowed in the ultrasound rooms during exams. Our front office staff cannot provide observation of children in our lobby area while testing is being conducted. An adult accompanying a patient to their appointment will only be allowed in the ultrasound room at the discretion of the ultrasound technician under special circumstances. We apologize for any inconvenience.     Please report to Radiology at the [facility] Main Entrance 30 minutes early for your test.  [address] [place], [place] [address]                         OR   Please report to Radiology at [facility] Main Entrance, medical mall, 30 mins prior to your test.  [address]  [place], [place]  How to Prepare for Your Cardiac PET/CT Stress Test:  Nothing to eat or drink, except water, 3 hours prior to arrival time.  NO caffeine/decaffeinated products, or chocolate 12 hours prior to arrival. (Please note decaffeinated beverages (teas/coffees) still contain caffeine).  If you have caffeine within 12 hours prior, the test will need to be rescheduled.  Medication instructions: Do not take erectile dysfunction  medications for 72 hours prior to test (sildenafil, tadalafil) Do not take nitrates (isosorbide mononitrate, Ranexa) the day before or day of test Do not take tamsulosin the day before or morning of test Hold theophylline containing medications for 12 hours. Hold Dipyridamole 48 hours prior to the test.  You may take your remaining medications with water.  NO perfume, cologne or lotion on chest or abdomen area.  Total time is 1 to 2 hours; you may want to bring reading material for the waiting time.  In preparation for your appointment, medication and supplies will be purchased.   Appointment availability is limited, so if you need to cancel or reschedule, please call the Radiology Department Scheduler at [phone] 24 hours in advance to avoid a cancellation fee of $100.00  What to Expect When you Arrive:  Once you arrive and check in for your appointment, you will be taken to a preparation room within the Radiology Department.  A technologist or Nurse will obtain your medical history, verify that you are correctly prepped for the exam, and explain the procedure.  Afterwards, an IV will be started in your arm and electrodes will be placed on your skin for EKG monitoring during the stress portion of the exam. Then you will be escorted to the PET/CT scanner.  There, staff will get you positioned on the scanner and obtain a blood pressure and EKG.  During the exam, you will continue to be connected to the EKG and blood pressure machines.  A small, safe amount of a radioactive tracer will be injected in your IV to obtain a series of pictures of your heart along with an injection of a stress agent.    After your Exam:  It is recommended that you eat a meal and drink a caffeinated beverage to counter act any effects of the stress agent.  Drink plenty of fluids for the remainder of the day and urinate frequently for the first couple of hours after the exam.  Your doctor will inform you of your test results within 7-10 business days.  For more information and frequently asked questions, please visit our website: [email or web address]  For questions about your test or how to prepare for your test, please call: Cardiac Imaging Nurse Navigators Office: [phone]   Follow-Up: At [facility], you and your health needs are our priority.  As part of our continuing mission to provide you with exceptional heart care, we have created designated Provider Care Teams.  These Care Teams include your primary Cardiologist (physician) and Advanced Practice Providers  (APPs -  Physician Assistants and Nurse Practitioners) who all work together to provide you with the care you need, when you need it.  We recommend signing up for the patient portal called "MyChart".  Sign up information is provided on this After Visit Summary.  MyChart is used to connect with patients for Virtual Visits (Telemedicine).  Patients are able to view lab/test results, encounter notes, upcoming appointments, etc.  Non-urgent messages can be sent to your provider as well.   To learn more about what you can do with MyChart, go to [email or web address].    Your next appointment:   6 week(s)  Provider:   You may see Charlton Haws, MD or one of the following Advanced Practice Providers on your designated Care Team:   Nicolasa Ducking, NP Eula Listen, PA-C Cadence Fransico Michael, PA-C Charlsie Quest, NP Carlos Levering, NP
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
## 2023-05-29 NOTE — Telephone Encounter (Signed)
 I called to schedule an echo and 6 weeks follow up from 05-29-2023 visit. I left message on voice mail.
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
## 2023-05-31 ENCOUNTER — Ambulatory Visit: Payer: BC Managed Care – PPO | Admitting: Pulmonary Disease

## 2023-05-31 ENCOUNTER — Encounter: Payer: Self-pay | Admitting: Pulmonary Disease

## 2023-05-31 VITALS — BP 123/88 | HR 85 | Ht 71.0 in | Wt 302.0 lb

## 2023-05-31 DIAGNOSIS — J449 Chronic obstructive pulmonary disease, unspecified: Secondary | ICD-10-CM | POA: Diagnosis not present

## 2023-05-31 DIAGNOSIS — R0600 Dyspnea, unspecified: Secondary | ICD-10-CM | POA: Diagnosis not present

## 2023-05-31 LAB — D-DIMER, QUANTITATIVE: D-Dimer, Quant: 0.84 ug{FEU}/mL — ABNORMAL HIGH (ref <0.50)

## 2023-05-31 MED ORDER — TRELEGY ELLIPTA 200-62.5-25 MCG/ACT IN AEPB: 1.0000 | INHALATION_SPRAY | Freq: Every day | RESPIRATORY_TRACT | 11 refills | Status: DC

## 2023-05-31 NOTE — Patient Instructions (Addendum)
 Use trelegy 200, 1 puff daily - rinse mouth out after each use  Use albuterol inhaler 1-2 puffs every 4-6 hours as needed  Continue montelukast 10mg  daily at bedtime  We will check a D-dimer and BMP today  If the D-dimer is elevated, we will check a CTA Chest scan and lower extremity ultrasound to evaluate for blood clots.  Follow up in 6 months, call sooner if needed
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
## 2023-06-01 LAB — BASIC METABOLIC PANEL
BUN: 17 mg/dL (ref 6–23)
CO2: 22 meq/L (ref 19–32)
Calcium: 9.3 mg/dL (ref 8.4–10.5)
Chloride: 104 meq/L (ref 96–112)
Creatinine, Ser: 1.14 mg/dL (ref 0.40–1.50)
GFR: 73.1 mL/min (ref >60.00)
Glucose, Bld: 88 mg/dL (ref 70–99)
Potassium: 4.2 meq/L (ref 3.5–5.1)
Sodium: 136 meq/L (ref 135–145)

## 2023-06-03 ENCOUNTER — Encounter: Payer: Self-pay | Admitting: Pulmonary Disease

## 2023-06-03 DIAGNOSIS — R7989 Other specified abnormal findings of blood chemistry: Secondary | ICD-10-CM

## 2023-06-03 DIAGNOSIS — R918 Other nonspecific abnormal finding of lung field: Secondary | ICD-10-CM

## 2023-06-04 ENCOUNTER — Ambulatory Visit (HOSPITAL_COMMUNITY)
Admission: RE | Admit: 2023-06-04 | Discharge: 2023-06-04 | Disposition: A | Discharge status: Home / Self Care | Payer: BC Managed Care – PPO | Source: Ambulatory Visit | Attending: Cardiovascular Disease | Admitting: Cardiovascular Disease

## 2023-06-04 ENCOUNTER — Ambulatory Visit
Admission: RE | Admit: 2023-06-04 | Discharge: 2023-06-04 | Disposition: A | Discharge status: Home / Self Care | Payer: BC Managed Care – PPO | Source: Ambulatory Visit | Attending: Pulmonary Disease | Admitting: Pulmonary Disease

## 2023-06-04 DIAGNOSIS — R7989 Other specified abnormal findings of blood chemistry: Secondary | ICD-10-CM

## 2023-06-04 MED ORDER — IOHEXOL 350 MG/ML SOLN
100.0000 mL | Freq: Once | INTRAVENOUS | Status: AC | PRN
  Administered 2023-06-04: 100 mL via INTRAVENOUS

## 2023-06-05 ENCOUNTER — Encounter: Payer: Self-pay | Admitting: Physical Therapy

## 2023-06-05 ENCOUNTER — Ambulatory Visit: Payer: BC Managed Care – PPO | Admitting: Physical Therapy

## 2023-06-05 VITALS — BP 130/89 | HR 80

## 2023-06-05 DIAGNOSIS — R262 Difficulty in walking, not elsewhere classified: Secondary | ICD-10-CM

## 2023-06-05 DIAGNOSIS — M25661 Stiffness of right knee, not elsewhere classified: Secondary | ICD-10-CM

## 2023-06-05 DIAGNOSIS — M6281 Muscle weakness (generalized): Secondary | ICD-10-CM

## 2023-06-05 DIAGNOSIS — R29898 Other symptoms and signs involving the musculoskeletal system: Secondary | ICD-10-CM

## 2023-06-05 DIAGNOSIS — M25561 Pain in right knee: Secondary | ICD-10-CM | POA: Diagnosis not present
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
## 2023-06-06 ENCOUNTER — Encounter: Payer: Self-pay | Admitting: Pulmonary Disease

## 2023-06-07 ENCOUNTER — Encounter: Payer: Self-pay | Admitting: Physical Therapy

## 2023-06-07 ENCOUNTER — Ambulatory Visit: Payer: BC Managed Care – PPO | Admitting: Physical Therapy

## 2023-06-07 DIAGNOSIS — M6281 Muscle weakness (generalized): Secondary | ICD-10-CM

## 2023-06-07 DIAGNOSIS — M25561 Pain in right knee: Secondary | ICD-10-CM | POA: Diagnosis not present

## 2023-06-07 DIAGNOSIS — R262 Difficulty in walking, not elsewhere classified: Secondary | ICD-10-CM

## 2023-06-07 DIAGNOSIS — M25661 Stiffness of right knee, not elsewhere classified: Secondary | ICD-10-CM

## 2023-06-07 DIAGNOSIS — R29898 Other symptoms and signs involving the musculoskeletal system: Secondary | ICD-10-CM
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
## 2023-06-09 LAB — HEPATIC FUNCTION PANEL
ALT: 41 IU/L (ref 0–44)
AST: 19 IU/L (ref 0–40)
Albumin: 4.4 g/dL (ref 3.8–4.9)
Alkaline Phosphatase: 70 IU/L (ref 44–121)
Bilirubin Total: 0.3 mg/dL (ref 0.0–1.2)
Bilirubin, Direct: 0.13 mg/dL (ref 0.00–0.40)
Total Protein: 6.6 g/dL (ref 6.0–8.5)

## 2023-06-09 LAB — LIPID PANEL
Chol/HDL Ratio: 2.6 ratio (ref 0.0–5.0)
Cholesterol, Total: 134 mg/dL (ref 100–199)
HDL: 51 mg/dL (ref >39)
LDL Chol Calc (NIH): 54 mg/dL (ref 0–99)
Triglycerides: 174 mg/dL — ABNORMAL HIGH (ref 0–149)
VLDL Cholesterol Cal: 29 mg/dL (ref 5–40)

## 2023-06-10 NOTE — Progress Notes (Signed)
 Huge improvement noted in LDL since taking atorvastatin.  Numbers dropped from 183 down to 54.  Liver functions have remained stable.  Continue current medication regimen without changes at this time.

## 2023-06-13 ENCOUNTER — Encounter: Payer: Self-pay | Admitting: Physical Therapy

## 2023-06-13 ENCOUNTER — Ambulatory Visit: Payer: BC Managed Care – PPO | Attending: Orthopedic Surgery | Admitting: Physical Therapy

## 2023-06-13 DIAGNOSIS — R29898 Other symptoms and signs involving the musculoskeletal system: Secondary | ICD-10-CM | POA: Diagnosis present

## 2023-06-13 DIAGNOSIS — M6281 Muscle weakness (generalized): Secondary | ICD-10-CM | POA: Insufficient documentation

## 2023-06-13 DIAGNOSIS — M25661 Stiffness of right knee, not elsewhere classified: Secondary | ICD-10-CM | POA: Diagnosis present

## 2023-06-13 DIAGNOSIS — M25561 Pain in right knee: Secondary | ICD-10-CM | POA: Diagnosis present

## 2023-06-13 DIAGNOSIS — R262 Difficulty in walking, not elsewhere classified: Secondary | ICD-10-CM | POA: Insufficient documentation
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
## 2023-06-18 ENCOUNTER — Ambulatory Visit: Payer: BC Managed Care – PPO

## 2023-06-18 DIAGNOSIS — R29898 Other symptoms and signs involving the musculoskeletal system: Secondary | ICD-10-CM

## 2023-06-18 DIAGNOSIS — M6281 Muscle weakness (generalized): Secondary | ICD-10-CM

## 2023-06-18 DIAGNOSIS — R262 Difficulty in walking, not elsewhere classified: Secondary | ICD-10-CM

## 2023-06-18 DIAGNOSIS — M25661 Stiffness of right knee, not elsewhere classified: Secondary | ICD-10-CM

## 2023-06-18 DIAGNOSIS — M25561 Pain in right knee: Secondary | ICD-10-CM | POA: Diagnosis not present
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
## 2023-06-20 ENCOUNTER — Ambulatory Visit: Payer: BC Managed Care – PPO | Admitting: Physical Therapy

## 2023-06-21 ENCOUNTER — Ambulatory Visit: Payer: BC Managed Care – PPO

## 2023-06-25 ENCOUNTER — Ambulatory Visit: Admitting: Physical Therapy

## 2023-06-26 ENCOUNTER — Ambulatory Visit: Admitting: Physical Therapy

## 2023-06-26 ENCOUNTER — Encounter: Payer: Self-pay | Admitting: Physical Therapy

## 2023-06-26 DIAGNOSIS — M6281 Muscle weakness (generalized): Secondary | ICD-10-CM

## 2023-06-26 DIAGNOSIS — M25561 Pain in right knee: Secondary | ICD-10-CM

## 2023-06-26 DIAGNOSIS — R262 Difficulty in walking, not elsewhere classified: Secondary | ICD-10-CM

## 2023-06-26 DIAGNOSIS — M25661 Stiffness of right knee, not elsewhere classified: Secondary | ICD-10-CM

## 2023-06-26 DIAGNOSIS — R29898 Other symptoms and signs involving the musculoskeletal system: Secondary | ICD-10-CM
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
## 2023-06-27 ENCOUNTER — Ambulatory Visit: Payer: BC Managed Care – PPO | Admitting: Physical Therapy

## 2023-07-02 ENCOUNTER — Ambulatory Visit: Payer: BC Managed Care – PPO | Admitting: Physical Therapy

## 2023-07-02 ENCOUNTER — Encounter: Payer: Self-pay | Admitting: Physical Therapy

## 2023-07-02 DIAGNOSIS — R29898 Other symptoms and signs involving the musculoskeletal system: Secondary | ICD-10-CM

## 2023-07-02 DIAGNOSIS — M6281 Muscle weakness (generalized): Secondary | ICD-10-CM

## 2023-07-02 DIAGNOSIS — M25661 Stiffness of right knee, not elsewhere classified: Secondary | ICD-10-CM

## 2023-07-02 DIAGNOSIS — M25561 Pain in right knee: Secondary | ICD-10-CM

## 2023-07-02 DIAGNOSIS — R262 Difficulty in walking, not elsewhere classified: Secondary | ICD-10-CM
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
## 2023-07-05 ENCOUNTER — Encounter: Payer: Self-pay | Admitting: Physical Therapy

## 2023-07-05 ENCOUNTER — Ambulatory Visit: Payer: BC Managed Care – PPO | Admitting: Physical Therapy

## 2023-07-05 DIAGNOSIS — R262 Difficulty in walking, not elsewhere classified: Secondary | ICD-10-CM

## 2023-07-05 DIAGNOSIS — M25561 Pain in right knee: Secondary | ICD-10-CM

## 2023-07-05 DIAGNOSIS — M6281 Muscle weakness (generalized): Secondary | ICD-10-CM

## 2023-07-05 DIAGNOSIS — R29898 Other symptoms and signs involving the musculoskeletal system: Secondary | ICD-10-CM

## 2023-07-05 DIAGNOSIS — M25661 Stiffness of right knee, not elsewhere classified: Secondary | ICD-10-CM
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
## 2023-07-09 ENCOUNTER — Encounter: Payer: BC Managed Care – PPO | Admitting: Physical Therapy

## 2023-07-10 ENCOUNTER — Encounter: Payer: Self-pay | Admitting: Physical Therapy

## 2023-07-10 ENCOUNTER — Ambulatory Visit: Payer: BC Managed Care – PPO | Attending: Orthopedic Surgery | Admitting: Physical Therapy

## 2023-07-10 DIAGNOSIS — M25661 Stiffness of right knee, not elsewhere classified: Secondary | ICD-10-CM | POA: Insufficient documentation

## 2023-07-10 DIAGNOSIS — M6281 Muscle weakness (generalized): Secondary | ICD-10-CM | POA: Insufficient documentation

## 2023-07-10 DIAGNOSIS — R262 Difficulty in walking, not elsewhere classified: Secondary | ICD-10-CM | POA: Insufficient documentation

## 2023-07-10 DIAGNOSIS — R29898 Other symptoms and signs involving the musculoskeletal system: Secondary | ICD-10-CM | POA: Insufficient documentation

## 2023-07-10 DIAGNOSIS — M25561 Pain in right knee: Secondary | ICD-10-CM | POA: Diagnosis present
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
## 2023-07-11 ENCOUNTER — Encounter: Payer: Self-pay | Admitting: Cardiology

## 2023-07-11 NOTE — Telephone Encounter (Signed)
error 

## 2023-07-12 ENCOUNTER — Ambulatory Visit: Admission: RE | Admit: 2023-07-12 | Payer: BC Managed Care – PPO | Source: Ambulatory Visit

## 2023-07-12 ENCOUNTER — Ambulatory Visit: Payer: BC Managed Care – PPO | Admitting: Physical Therapy

## 2023-07-16 ENCOUNTER — Encounter: Payer: Self-pay | Admitting: *Deleted

## 2023-07-16 ENCOUNTER — Encounter: Payer: Self-pay | Admitting: Physical Therapy

## 2023-07-16 ENCOUNTER — Ambulatory Visit: Attending: Cardiology

## 2023-07-16 ENCOUNTER — Ambulatory Visit: Payer: BC Managed Care – PPO | Admitting: Physical Therapy

## 2023-07-16 DIAGNOSIS — R931 Abnormal findings on diagnostic imaging of heart and coronary circulation: Secondary | ICD-10-CM

## 2023-07-16 DIAGNOSIS — M25661 Stiffness of right knee, not elsewhere classified: Secondary | ICD-10-CM

## 2023-07-16 DIAGNOSIS — R29898 Other symptoms and signs involving the musculoskeletal system: Secondary | ICD-10-CM

## 2023-07-16 DIAGNOSIS — R262 Difficulty in walking, not elsewhere classified: Secondary | ICD-10-CM

## 2023-07-16 DIAGNOSIS — M25561 Pain in right knee: Secondary | ICD-10-CM | POA: Diagnosis not present

## 2023-07-16 DIAGNOSIS — M6281 Muscle weakness (generalized): Secondary | ICD-10-CM

## 2023-07-16 DIAGNOSIS — R0602 Shortness of breath: Secondary | ICD-10-CM | POA: Diagnosis not present

## 2023-07-16 LAB — ECHOCARDIOGRAM COMPLETE
AR max vel: 3.61 cm2
AV Area VTI: 3.7 cm2
AV Area mean vel: 3.56 cm2
AV Mean grad: 4 mmHg
AV Peak grad: 7.6 mmHg
Ao pk vel: 1.38 m/s
Area-P 1/2: 3.65 cm2
S' Lateral: 3.3 cm

## 2023-07-16 NOTE — Progress Notes (Signed)
 Heart squeeze is noted 55 to 60% which is normal function, no regional wall motion abnormalities which were noted, right ventricular systolic size and function is normal, no valvular abnormalities were noted.  

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
## 2023-07-17 ENCOUNTER — Encounter: Payer: Self-pay | Admitting: *Deleted

## 2023-07-17 ENCOUNTER — Telehealth: Payer: Self-pay | Admitting: *Deleted

## 2023-07-17 ENCOUNTER — Ambulatory Visit: Payer: BC Managed Care – PPO | Admitting: Cardiology

## 2023-07-17 NOTE — Telephone Encounter (Signed)
 Pt returning call, requesting cb, needs to speak with someone regarding stress test and appt

## 2023-07-17 NOTE — Telephone Encounter (Signed)
 Spoke with patient and reviewed information on appointment and testing. He verbalized understanding with no further questions at this time. He did inquire if insurance had approved and advised it is still pending.

## 2023-07-17 NOTE — Telephone Encounter (Signed)
 Left voicemail message to call back so that we can reschedule his appointment until after his testing has been completed. Instructed him to call back.

## 2023-07-17 NOTE — Progress Notes (Unsigned)
error 

## 2023-07-19 ENCOUNTER — Encounter: Payer: Self-pay | Admitting: Physical Therapy

## 2023-07-19 ENCOUNTER — Ambulatory Visit: Payer: BC Managed Care – PPO | Admitting: Physical Therapy

## 2023-07-19 DIAGNOSIS — R262 Difficulty in walking, not elsewhere classified: Secondary | ICD-10-CM

## 2023-07-19 DIAGNOSIS — M25561 Pain in right knee: Secondary | ICD-10-CM | POA: Diagnosis not present

## 2023-07-19 DIAGNOSIS — R29898 Other symptoms and signs involving the musculoskeletal system: Secondary | ICD-10-CM

## 2023-07-19 DIAGNOSIS — M25661 Stiffness of right knee, not elsewhere classified: Secondary | ICD-10-CM

## 2023-07-19 DIAGNOSIS — M6281 Muscle weakness (generalized): Secondary | ICD-10-CM
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
## 2023-07-23 ENCOUNTER — Ambulatory Visit: Payer: BC Managed Care – PPO | Admitting: Physical Therapy

## 2023-07-23 ENCOUNTER — Encounter: Payer: Self-pay | Admitting: Physical Therapy

## 2023-07-23 DIAGNOSIS — R262 Difficulty in walking, not elsewhere classified: Secondary | ICD-10-CM

## 2023-07-23 DIAGNOSIS — R29898 Other symptoms and signs involving the musculoskeletal system: Secondary | ICD-10-CM

## 2023-07-23 DIAGNOSIS — M6281 Muscle weakness (generalized): Secondary | ICD-10-CM

## 2023-07-23 DIAGNOSIS — M25561 Pain in right knee: Secondary | ICD-10-CM | POA: Diagnosis not present

## 2023-07-23 DIAGNOSIS — M25661 Stiffness of right knee, not elsewhere classified: Secondary | ICD-10-CM
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
## 2023-07-25 ENCOUNTER — Encounter: Payer: Self-pay | Admitting: Physical Therapy

## 2023-07-25 ENCOUNTER — Ambulatory Visit: Payer: BC Managed Care – PPO | Admitting: Physical Therapy

## 2023-07-25 DIAGNOSIS — R262 Difficulty in walking, not elsewhere classified: Secondary | ICD-10-CM

## 2023-07-25 DIAGNOSIS — M6281 Muscle weakness (generalized): Secondary | ICD-10-CM

## 2023-07-25 DIAGNOSIS — M25561 Pain in right knee: Secondary | ICD-10-CM

## 2023-07-25 DIAGNOSIS — R29898 Other symptoms and signs involving the musculoskeletal system: Secondary | ICD-10-CM

## 2023-07-25 DIAGNOSIS — M25661 Stiffness of right knee, not elsewhere classified: Secondary | ICD-10-CM
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
## 2023-07-30 ENCOUNTER — Ambulatory Visit: Payer: BC Managed Care – PPO | Admitting: Physical Therapy

## 2023-08-01 ENCOUNTER — Ambulatory Visit: Payer: BC Managed Care – PPO | Admitting: Physical Therapy

## 2023-08-01 ENCOUNTER — Telehealth: Payer: Self-pay | Admitting: Physical Therapy

## 2023-08-01 NOTE — Telephone Encounter (Signed)
 Left VM notifying patient of missed PT visit scheduled at 1:45pm today. Requested they call back to reschedule, confirm next appointment, or let us  know of any changes in PT plans. Let patient know that with any no-show I am required to review our cancellation policy that after 2 no-shows we remove future visits from the schedule, they are responsible to calling in to reschedule, and they will only be able to schedule one appointment at a time and/or we may remove a patient from the schedule.   Patient called back a few min later and said he has been sick with lung involvement that prompted him to go to the doctor yesterday (documented in his chart) where he was given antibiotics and prednisone . He did not sleep well last night and was asleep when PT called. He thought his PT appointment was tomorrow. He apologized profusely for missing the appointment. He expected to be better by Monday and confirmed his appointment that day, with agreement to call and cancel if he is unable to make it. Also reviewed fever and vomiting restrictions for attending PT (he has not had either so far).    Carilyn Charles. Artemio Larry, PT, DPT 08/01/23, 2:00 PM  [facility] [address] [place], [place] [address] P: [phone] I F: [phone]

## 2023-08-06 ENCOUNTER — Encounter: Payer: Self-pay | Admitting: Physical Therapy

## 2023-08-06 ENCOUNTER — Ambulatory Visit: Payer: BC Managed Care – PPO | Admitting: Physical Therapy

## 2023-08-06 DIAGNOSIS — M25561 Pain in right knee: Secondary | ICD-10-CM

## 2023-08-06 DIAGNOSIS — R29898 Other symptoms and signs involving the musculoskeletal system: Secondary | ICD-10-CM

## 2023-08-06 DIAGNOSIS — R262 Difficulty in walking, not elsewhere classified: Secondary | ICD-10-CM

## 2023-08-06 DIAGNOSIS — M6281 Muscle weakness (generalized): Secondary | ICD-10-CM

## 2023-08-06 DIAGNOSIS — M25661 Stiffness of right knee, not elsewhere classified: Secondary | ICD-10-CM
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
## 2023-08-08 ENCOUNTER — Ambulatory Visit: Payer: BC Managed Care – PPO | Admitting: Physical Therapy

## 2023-08-13 ENCOUNTER — Ambulatory Visit: Payer: BC Managed Care – PPO | Admitting: Physical Therapy

## 2023-08-15 ENCOUNTER — Ambulatory Visit: Payer: BC Managed Care – PPO | Admitting: Physical Therapy

## 2023-08-20 ENCOUNTER — Ambulatory Visit: Payer: BC Managed Care – PPO | Admitting: Physical Therapy

## 2023-08-23 ENCOUNTER — Ambulatory Visit: Payer: BC Managed Care – PPO | Admitting: Physical Therapy

## 2023-08-27 ENCOUNTER — Encounter: Payer: BC Managed Care – PPO | Admitting: Physical Therapy

## 2023-08-29 ENCOUNTER — Encounter: Payer: BC Managed Care – PPO | Admitting: Physical Therapy

## 2023-08-29 ENCOUNTER — Telehealth: Payer: Self-pay

## 2023-08-29 DIAGNOSIS — R931 Abnormal findings on diagnostic imaging of heart and coronary circulation: Secondary | ICD-10-CM

## 2023-08-29 DIAGNOSIS — R0600 Dyspnea, unspecified: Secondary | ICD-10-CM

## 2023-08-29 NOTE — Telephone Encounter (Signed)
-----   Message from [facility] sent at 08/29/2023 10:24 AM EDT ----- Regarding: FW: AUTH DENIAL Since his insurance denied his PET stress can we see if the patient is willing to undergo a Lexiscan stress test. It appears his insurance approved one previously but he never had it done. If he agrees to the test please have it scheduled. This may also cause his follow up appointment needing to be changed to a later date after the testing is complete. Thanks, [name] [name] ----- Message ----- From: [name] [name] Sent: 07/18/2023   8:43 AM EDT To: [name] Rule, MD; [name] Cockayne, NP; # Subject: [name] [name]                                    Good morning,   [employer] [place]  has denied pt's auth for PET scan. They wanted the results from a nuclear stress test that was ordered but not performed.   Katelyn, are you able to take pt off schedule until we find a resolve?   [employer] has reviewed the clinical information submitted for this request and determined that the  information provided does not support medical necessity as defined in your Benefit Booklet or  Contract for the following:   After a review of the notes that were sent, the requested Heart PET Scan with CT for  Attenuation is denied at this time.   This request for Heart PET Scan with CT for Attenuation was denied because clinical  information needed to make a decision was not submitted by your physician. The following  clinical notes were requested: notes from your doctor that tell us  what the results were from  the approved test ( heart test (Myocardial Perfusion Imaging)). That approved test was  requested on 09/06/2022 and was valid until 10/06/2022. If you had that test done, we need to  know why you need another one.   [employer] Clinical Guideline 079 for Heart PET Scan with CT for Attenuation was used to  make this decision.  The definition of "medically necessary" in your Benefit Booklet is: Medical Necessity: Your health  plan will provide coverage for medically necessary services  when it is determined that the medical criteria and guidelines below are met: General:  medically appropriate for the symptoms and diagnosis, treatment of the condition,  illness, disease or injury  provided for the diagnosis, or the direct care and treatment of the member's condition,  illness, disease or injury  in accordance with generally accepted practice standards of good medical practice in the  community   not primarily for the convenience of the patient, the patient's family, the patient's  provider  service or supply must not be experimental, investigational or cosmetic in purpose. ##Only the member's medical condition is considered when deciding medical necessity. The  fact that a physician ordered, prescribed, recommended or approved a service or supply does  not, in itself, make the services medically necessary.  If the requesting provider would like to discuss this case with our reviewer please call [phone]. Using reference# [id number].  Thanks,  [employer]

## 2023-08-29 NOTE — Telephone Encounter (Signed)
 Called patient to see if he was agreeable to the Lexiscan that insurance has approved. - patient advised that due to work schedule, he needs to schedule for at least 2 weeks out  Order for Lexiscan re-entered, information for procedure [facility] to patient, forwarded encounter to Scheduling

## 2023-09-04 ENCOUNTER — Encounter: Payer: BC Managed Care – PPO | Admitting: Physical Therapy

## 2023-09-06 ENCOUNTER — Other Ambulatory Visit

## 2023-09-06 ENCOUNTER — Encounter: Payer: BC Managed Care – PPO | Admitting: Physical Therapy

## 2023-09-11 ENCOUNTER — Ambulatory Visit: Admitting: Cardiology

## 2023-09-14 ENCOUNTER — Telehealth (HOSPITAL_COMMUNITY): Payer: Self-pay | Admitting: *Deleted

## 2023-09-14 ENCOUNTER — Encounter (HOSPITAL_COMMUNITY): Payer: Self-pay | Admitting: *Deleted

## 2023-09-14 NOTE — Telephone Encounter (Signed)
 Reminder and instructions sent via [employer]  for upcoming stress test on 10/01/23 at 10:00

## 2023-09-19 ENCOUNTER — Other Ambulatory Visit: Payer: Self-pay | Admitting: Cardiology

## 2023-09-19 DIAGNOSIS — R0600 Dyspnea, unspecified: Secondary | ICD-10-CM

## 2023-09-19 DIAGNOSIS — R931 Abnormal findings on diagnostic imaging of heart and coronary circulation: Secondary | ICD-10-CM

## 2023-09-24 ENCOUNTER — Telehealth (HOSPITAL_COMMUNITY): Payer: Self-pay | Admitting: *Deleted

## 2023-09-24 ENCOUNTER — Encounter (HOSPITAL_COMMUNITY): Payer: Self-pay | Admitting: *Deleted

## 2023-09-24 NOTE — Telephone Encounter (Signed)
 Reminder and instructions sent via [employer] for upcoming stress test on 10/01/23

## 2023-09-28 ENCOUNTER — Encounter (HOSPITAL_COMMUNITY): Payer: Self-pay

## 2023-10-01 ENCOUNTER — Ambulatory Visit (HOSPITAL_COMMUNITY): Admission: RE | Admit: 2023-10-01 | Source: Ambulatory Visit | Attending: Cardiology | Admitting: Cardiology

## 2023-11-15 ENCOUNTER — Ambulatory Visit (HOSPITAL_COMMUNITY)

## 2023-11-20 ENCOUNTER — Ambulatory Visit
Admission: RE | Admit: 2023-11-20 | Discharge: 2023-11-20 | Disposition: A | Discharge status: Home / Self Care | Source: Ambulatory Visit | Attending: Pulmonary Disease | Admitting: Pulmonary Disease

## 2023-11-20 DIAGNOSIS — R918 Other nonspecific abnormal finding of lung field: Secondary | ICD-10-CM | POA: Insufficient documentation

## 2023-12-14 ENCOUNTER — Other Ambulatory Visit: Payer: Self-pay | Admitting: Nurse Practitioner

## 2023-12-14 DIAGNOSIS — J432 Centrilobular emphysema: Secondary | ICD-10-CM

## 2023-12-29 ENCOUNTER — Ambulatory Visit: Payer: Self-pay | Admitting: Pulmonary Disease

## 2024-02-12 ENCOUNTER — Other Ambulatory Visit: Payer: Self-pay | Admitting: Nurse Practitioner

## 2024-02-12 DIAGNOSIS — J4520 Mild intermittent asthma, uncomplicated: Secondary | ICD-10-CM

## 2024-02-12 DIAGNOSIS — J449 Chronic obstructive pulmonary disease, unspecified: Secondary | ICD-10-CM

## 2024-02-12 DIAGNOSIS — J432 Centrilobular emphysema: Secondary | ICD-10-CM

## 2024-02-12 MED ORDER — TRELEGY ELLIPTA 200-62.5-25 MCG/ACT IN AEPB: 1.0000 | INHALATION_SPRAY | Freq: Every day | RESPIRATORY_TRACT | 11 refills | Status: AC

## 2024-02-12 MED ORDER — MONTELUKAST SODIUM 10 MG PO TABS: 10.0000 mg | ORAL_TABLET | Freq: Every day | ORAL | 11 refills | Status: AC

## 2024-02-12 MED ORDER — ALBUTEROL SULFATE HFA 108 (90 BASE) MCG/ACT IN AERS: 2.0000 | INHALATION_SPRAY | Freq: Four times a day (QID) | RESPIRATORY_TRACT | 0 refills | Status: DC | PRN

## 2024-02-12 NOTE — Telephone Encounter (Signed)
 Copied from CRM [id number]. Topic: Clinical - Prescription Issue >> Feb 12, 2024 10:00 AM Devaughn RAMAN wrote: Reason for CRM: Pt called and stated he received a phone call from his [facility] regarding his montelukast  (SINGULAIR ) 10 MG tablet, Fluticasone -Umeclidin-Vilant (TRELEGY ELLIPTA ) 200-62.5-25 MCG/ACT AEPB, and albuterol  (VENTOLIN  HFA) 108 (90 Base) MCG/ACT inhaler medications being denied. Pt would like to know why his medications are being denied and if he needs an office visit to get refills.  I have refilled these medications.  Pt has also been scheduled ov with KC. Nothing further needed.

## 2024-03-12 ENCOUNTER — Other Ambulatory Visit: Payer: Self-pay | Admitting: Cardiology

## 2024-03-13 ENCOUNTER — Encounter: Payer: Self-pay | Admitting: Nurse Practitioner

## 2024-03-13 ENCOUNTER — Ambulatory Visit: Admitting: Nurse Practitioner

## 2024-03-13 VITALS — BP 132/86 | HR 82 | Temp 97.8°F | Ht 70.5 in | Wt 290.2 lb

## 2024-03-13 DIAGNOSIS — R5381 Other malaise: Secondary | ICD-10-CM

## 2024-03-13 DIAGNOSIS — J4520 Mild intermittent asthma, uncomplicated: Secondary | ICD-10-CM

## 2024-03-13 DIAGNOSIS — J4489 Other specified chronic obstructive pulmonary disease: Secondary | ICD-10-CM

## 2024-03-13 DIAGNOSIS — E66813 Obesity, class 3: Secondary | ICD-10-CM

## 2024-03-13 DIAGNOSIS — J439 Emphysema, unspecified: Secondary | ICD-10-CM

## 2024-03-13 DIAGNOSIS — J432 Centrilobular emphysema: Secondary | ICD-10-CM

## 2024-03-13 LAB — CBC WITH DIFFERENTIAL/PLATELET
Basophils Absolute: 0 K/uL (ref 0.0–0.1)
Basophils Relative: 0.4 % (ref 0.0–3.0)
Eosinophils Absolute: 0.1 K/uL (ref 0.0–0.7)
Eosinophils Relative: 2.3 % (ref 0.0–5.0)
HCT: 45.8 % (ref 39.0–52.0)
Hemoglobin: 15.7 g/dL (ref 13.0–17.0)
Lymphocytes Relative: 27.8 % (ref 12.0–46.0)
Lymphs Abs: 1.7 K/uL (ref 0.7–4.0)
MCHC: 34.2 g/dL (ref 30.0–36.0)
MCV: 91.6 fl (ref 78.0–100.0)
Monocytes Absolute: 0.4 K/uL (ref 0.1–1.0)
Monocytes Relative: 6.4 % (ref 3.0–12.0)
Neutro Abs: 3.8 K/uL (ref 1.4–7.7)
Neutrophils Relative %: 63.1 % (ref 43.0–77.0)
Platelets: 218 K/uL (ref 150.0–400.0)
RBC: 5.01 Mil/uL (ref 4.22–5.81)
RDW: 12.8 % (ref 11.5–15.5)
WBC: 6 K/uL (ref 4.0–10.5)

## 2024-03-13 LAB — NITRIC OXIDE: Nitric Oxide: 9

## 2024-03-13 NOTE — Patient Instructions (Addendum)
 Continue Trelegy 1 puff. Brush tongue and rinse mouth afterwards Continue Albuterol  inhaler 2 puffs every 6 hours as needed for shortness of breath or wheezing. Notify if symptoms persist despite rescue inhaler/neb use.  Continue montelukast  1 tab daily   Your breathing test today was normal   I would like to check some allergy markers to see if you have something driving inflammation in your airway and causing more congestion/wheezing Labs today  Follow up in 6 months with Dr. Kara. If we make any changes based on lab work, we may have you come back in sooner. If symptoms do not improve or worsen, please contact office for sooner follow up or seek emergency care.
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
## 2024-03-14 ENCOUNTER — Ambulatory Visit: Payer: Self-pay | Admitting: Nurse Practitioner

## 2024-03-14 ENCOUNTER — Encounter: Payer: Self-pay | Admitting: Nurse Practitioner

## 2024-03-14 NOTE — Telephone Encounter (Signed)
Please contact pt for future appointment. Pt overdue for follow up.

## 2024-03-14 NOTE — Telephone Encounter (Signed)
 LVM to schedule f/u appt

## 2024-03-16 LAB — ALLERGEN PANEL (27) + IGE
Alternaria Alternata IgE: <0.1 kU/L
Aspergillus Fumigatus IgE: <0.1 kU/L
Bahia Grass IgE: <0.1 kU/L
Bermuda Grass IgE: <0.1 kU/L
Cat Dander IgE: <0.1 kU/L
Cedar, Mountain IgE: <0.1 kU/L
Cladosporium Herbarum IgE: <0.1 kU/L
Cocklebur IgE: <0.1 kU/L
Cockroach, American IgE: <0.1 kU/L
Common Silver Birch IgE: <0.1 kU/L
D Farinae IgE: <0.1 kU/L
D Pteronyssinus IgE: <0.1 kU/L
Dog Dander IgE: <0.1 kU/L
Elm, American IgE: <0.1 kU/L
Hickory, White IgE: <0.1 kU/L
IgE (Immunoglobulin E), Serum: 18 [IU]/mL (ref 6–495)
Johnson Grass IgE: <0.1 kU/L
Kentucky Bluegrass IgE: <0.1 kU/L
Maple/Box Elder IgE: <0.1 kU/L
Mucor Racemosus IgE: <0.1 kU/L
Oak, White IgE: <0.1 kU/L
Penicillium Chrysogen IgE: <0.1 kU/L
Pigweed, Rough IgE: <0.1 kU/L
Plantain, English IgE: <0.1 kU/L
Ragweed, Short IgE: <0.1 kU/L
Setomelanomma Rostrat: <0.1 kU/L
Timothy Grass IgE: <0.1 kU/L
White Mulberry IgE: <0.1 kU/L

## 2024-03-18 ENCOUNTER — Other Ambulatory Visit: Payer: Self-pay

## 2024-03-18 DIAGNOSIS — J432 Centrilobular emphysema: Secondary | ICD-10-CM

## 2024-03-18 MED ORDER — ALBUTEROL SULFATE HFA 108 (90 BASE) MCG/ACT IN AERS: 2.0000 | INHALATION_SPRAY | Freq: Four times a day (QID) | RESPIRATORY_TRACT | 11 refills | Status: AC | PRN

## 2024-05-06 NOTE — Telephone Encounter (Signed)
 Pt scheduled on 05/27/24

## 2024-05-27 ENCOUNTER — Ambulatory Visit: Admitting: Cardiology
